# Patient Record
Sex: Male | Born: 1970
Health system: Southern US, Community
[De-identification: ages and names within clinical notes are randomized; demographics above are authoritative.]

## PROBLEM LIST (undated history)

## (undated) DIAGNOSIS — T7840XA Allergy, unspecified, initial encounter: Secondary | ICD-10-CM

## (undated) DIAGNOSIS — K219 Gastro-esophageal reflux disease without esophagitis: Secondary | ICD-10-CM

## (undated) DIAGNOSIS — K449 Diaphragmatic hernia without obstruction or gangrene: Secondary | ICD-10-CM

## (undated) DIAGNOSIS — R972 Elevated prostate specific antigen [PSA]: Secondary | ICD-10-CM

## (undated) DIAGNOSIS — D351 Benign neoplasm of parathyroid gland: Secondary | ICD-10-CM

## (undated) DIAGNOSIS — Z800 Family history of malignant neoplasm of digestive organs: Secondary | ICD-10-CM

## (undated) HISTORY — DX: Diaphragmatic hernia without obstruction or gangrene: K44.9

## (undated) HISTORY — DX: Family history of malignant neoplasm of digestive organs: Z80.0

## (undated) HISTORY — DX: Elevated prostate specific antigen (PSA): R97.20

## (undated) HISTORY — DX: Gastro-esophageal reflux disease without esophagitis: K21.9

## (undated) HISTORY — DX: Benign neoplasm of parathyroid gland: D35.1

## (undated) HISTORY — PX: WRIST SURGERY: SHX841

## (undated) HISTORY — DX: Allergy, unspecified, initial encounter: T78.40XA

---

## 2007-08-10 ENCOUNTER — Ambulatory Visit: Payer: Self-pay | Admitting: Gastroenterology

## 2007-08-10 LAB — CONVERTED CEMR LAB
ALT: 44 units/L (ref 0–53)
Alkaline Phosphatase: 81 units/L (ref 39–117)
BUN: 12 mg/dL (ref 6–23)
Basophils Absolute: 0.1 10*3/uL (ref 0.0–0.1)
CO2: 30 meq/L (ref 19–32)
Calcium: 10.6 mg/dL — ABNORMAL HIGH (ref 8.4–10.5)
Chloride: 107 meq/L (ref 96–112)
Creatinine, Ser: 0.9 mg/dL (ref 0.4–1.5)
Eosinophils Relative: 6.1 % — ABNORMAL HIGH (ref 0.0–5.0)
GFR calc Af Amer: 122 mL/min
HCT: 45.1 % (ref 39.0–52.0)
Hemoglobin: 15.8 g/dL (ref 13.0–17.0)
Lymphocytes Relative: 46.7 % — ABNORMAL HIGH (ref 12.0–46.0)
Monocytes Absolute: 0.6 10*3/uL (ref 0.2–0.7)
Monocytes Relative: 7.6 % (ref 3.0–11.0)
Neutro Abs: 3.1 10*3/uL (ref 1.4–7.7)
Neutrophils Relative %: 38.6 % — ABNORMAL LOW (ref 43.0–77.0)
Platelets: 215 10*3/uL (ref 150–400)
Sodium: 142 meq/L (ref 135–145)
TSH: 1.37 microintl units/mL (ref 0.35–5.50)
Total Protein: 7 g/dL (ref 6.0–8.3)
WBC: 8 10*3/uL (ref 4.5–10.5)

## 2007-08-26 ENCOUNTER — Ambulatory Visit: Payer: Self-pay | Admitting: Gastroenterology

## 2007-08-26 LAB — CONVERTED CEMR LAB: Calcium Ionized: 1.55 mmol/L — ABNORMAL HIGH (ref 1.12–1.32)

## 2007-09-02 ENCOUNTER — Ambulatory Visit: Payer: Self-pay | Admitting: Gastroenterology

## 2007-09-02 LAB — CONVERTED CEMR LAB: Calcium, Total (PTH): 11.6 mg/dL — ABNORMAL HIGH (ref 8.4–10.5)

## 2007-09-17 ENCOUNTER — Ambulatory Visit: Payer: Self-pay | Admitting: Gastroenterology

## 2007-09-20 ENCOUNTER — Encounter: Payer: Self-pay | Admitting: Endocrinology

## 2007-09-20 ENCOUNTER — Ambulatory Visit: Payer: Self-pay | Admitting: Endocrinology

## 2007-09-20 DIAGNOSIS — E210 Primary hyperparathyroidism: Secondary | ICD-10-CM | POA: Insufficient documentation

## 2007-09-20 LAB — CONVERTED CEMR LAB: Prolactin: 6.2 ng/mL

## 2007-09-28 ENCOUNTER — Telehealth (INDEPENDENT_AMBULATORY_CARE_PROVIDER_SITE_OTHER): Payer: Self-pay | Admitting: *Deleted

## 2007-10-12 ENCOUNTER — Encounter: Payer: Self-pay | Admitting: Endocrinology

## 2007-10-15 ENCOUNTER — Ambulatory Visit (HOSPITAL_COMMUNITY): Admission: RE | Admit: 2007-10-15 | Discharge: 2007-10-15 | Payer: Self-pay | Admitting: Psychology

## 2007-10-26 ENCOUNTER — Encounter: Payer: Self-pay | Admitting: Endocrinology

## 2007-11-25 HISTORY — PX: PARATHYROIDECTOMY: SHX19

## 2008-02-01 DIAGNOSIS — J309 Allergic rhinitis, unspecified: Secondary | ICD-10-CM | POA: Insufficient documentation

## 2008-02-01 DIAGNOSIS — R1011 Right upper quadrant pain: Secondary | ICD-10-CM | POA: Insufficient documentation

## 2008-02-01 DIAGNOSIS — A779 Spotted fever, unspecified: Secondary | ICD-10-CM | POA: Insufficient documentation

## 2008-02-01 DIAGNOSIS — K219 Gastro-esophageal reflux disease without esophagitis: Secondary | ICD-10-CM | POA: Insufficient documentation

## 2008-02-01 DIAGNOSIS — K449 Diaphragmatic hernia without obstruction or gangrene: Secondary | ICD-10-CM | POA: Insufficient documentation

## 2008-02-11 ENCOUNTER — Ambulatory Visit (HOSPITAL_COMMUNITY): Admission: RE | Admit: 2008-02-11 | Discharge: 2008-02-11 | Payer: Self-pay | Admitting: General Surgery

## 2008-02-15 ENCOUNTER — Encounter: Payer: Self-pay | Admitting: Endocrinology

## 2008-03-01 ENCOUNTER — Telehealth (INDEPENDENT_AMBULATORY_CARE_PROVIDER_SITE_OTHER): Payer: Self-pay | Admitting: *Deleted

## 2008-03-07 ENCOUNTER — Telehealth: Payer: Self-pay | Admitting: Endocrinology

## 2008-04-18 ENCOUNTER — Encounter: Payer: Self-pay | Admitting: Gastroenterology

## 2008-04-18 ENCOUNTER — Encounter: Payer: Self-pay | Admitting: Endocrinology

## 2008-05-23 ENCOUNTER — Encounter: Payer: Self-pay | Admitting: Endocrinology

## 2008-05-29 ENCOUNTER — Encounter: Payer: Self-pay | Admitting: Endocrinology

## 2008-05-29 ENCOUNTER — Encounter: Payer: Self-pay | Admitting: Gastroenterology

## 2008-05-30 ENCOUNTER — Encounter: Payer: Self-pay | Admitting: Endocrinology

## 2008-06-27 ENCOUNTER — Encounter: Payer: Self-pay | Admitting: Gastroenterology

## 2008-06-27 ENCOUNTER — Encounter: Payer: Self-pay | Admitting: Endocrinology

## 2009-06-01 IMAGING — NM NM OCTREOTIDE LOCALIZATION
1 series · 6 of 6 positions shown · non-contrast
Comparison: none

CLINICAL DATA: Hyperthyroidism.  Evaluate for hyperparathyroidism.
 NUCLEAR MEDICINE PARATHYROID SCINTIGRAPHY AND SPECT IMAGING ? 10/15/07:
TECHNIQUE: Following intravenous administration of radiopharmaceutical, early and 2-hour delayed planar images were obtained in the anterior projection.  Delayed triplanar SPECT images were also obtained at 2 hours.  
 Radiopharmaceutical:  25 mCi Bc-33m sestamibi.

[Series 1: ps parathyroid spect · 4.70mm/px · 6 of 64 frames shown]
[frame 6/64]
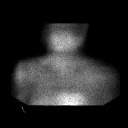
[frame 16/64]
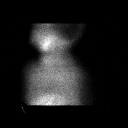
[frame 27/64]
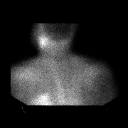
[frame 38/64]
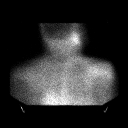
[frame 48/64]
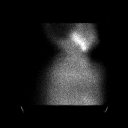
[frame 59/64]
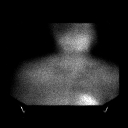

[6 of 6 positions shown; findings below may reference images not displayed]

FINDINGS: Images of the neck were obtained at 15 minutes, one hour, and two hours after injection.  No residual activity is seen to indicate a parathyroid adenoma.
IMPRESSION: Negative nuclear medicine study for parathyroid adenoma.

## 2009-07-05 ENCOUNTER — Telehealth: Payer: Self-pay | Admitting: Orthopedic Surgery

## 2009-07-26 ENCOUNTER — Encounter: Payer: Self-pay | Admitting: Orthopedic Surgery

## 2011-04-08 NOTE — Assessment & Plan Note (Signed)
[facility]                         GASTROENTEROLOGY OFFICE NOTE   Cole Espinoza, Cole Espinoza                    MRN:          [id number]  DATE:09/02/2007                            DOB:          [birth year]    Cole Espinoza continues to complain of intermittent subxyphoid discomfort  despite taking daily Nexium.  All of his laboratory data was  unremarkable, except for an elevated calcium and a slightly elevated  ionized calcium level of 1.55, normal 1.12 to 1.32.  I have sent him by  the lab today to check a parahormone level.   Vital signs were all normal, as is abdominal exam.  I cannot really  appreciate any abdominal masses or localized areas of tenderness.  Bowel  sounds are normal.   RECOMMENDATIONS:  1. Endoscopic exam.  2. Continue Nexium with p.r.n. Levsin use.  3. Perhaps he needs Endocrinology referral.     Vania Rea. Jarold Motto, MD, Caleen Essex, FAGA  Electronically Signed    DRP/MedQ  DD: 09/02/2007  DT: 09/02/2007  Job #: [id number]

## 2011-04-08 NOTE — Assessment & Plan Note (Signed)
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  Farris Has for evaluation of right-  sided chest-upper quadrant pain.   HISTORY OF PRESENT ILLNESS:  Cole Espinoza has had recurrent cramping and sharp  pain just to the right of his subxyphoid area on-and-off for the last 4-  5 years with more intense episodes over the last several weeks. These  are usually precipitated by eating and will last 1-2 hours in duration  and are partially relieved by over-the-counter Prilosec. He does have  some reflux symptoms especially at nighttime with burning substernal  chest pain and regurgitation. He has had no dysphagia. He recently saw  Dr.  Farris Has and had upper abdominal ultrasound examination performed at  [facility] on July 23, 2007 and this was entirely  normal.   The patient denies clay colored stools, dark urine, icterus, or other  hepatobiliary complaints. He has never had barium studies or endoscopic  examinations. He follows a regular diet and denies specific food  intolerances. He denies bowel irregularity or melena or hematochezia.   PAST MEDICAL HISTORY:  The patient was recently treated for a tick bite  exposure with possible early [facility] Spotted Fever and is on  doxycycline. He otherwise has been in good health and denies chronic  medical problems.   FAMILY HISTORY:  Is remarkable for colon cancer in one grandparent and  prostate in another. There is no known other gastrointestinal problems  in his family.   SOCIAL HISTORY:  He is married. He lives with his wife and one child. He  has a bachelor's degree in forestry and is actively employed. He does  not smoke or use  ethanol.   REVIEW OF SYSTEMS:  Currently noncontributory, although he was having  fever, chills, night sweats before beginning doxycycline. He denies  current cardiovascular or pulmonary symptoms. His chest pain does not  radiate into his back and there is no associated genitourinary  complaints.   PHYSICAL EXAMINATION:  He is a healthy-appearing white male in no  distress. Appears his stated age. He is 6 feet tall and weighs 210  pounds. Blood pressure 124/70, pulse 84 and regular.  I could not appreciate stigmata of chronic liver disease and there was  no thyromegaly.  CHEST: Was  entirely clear.  He is in a regular rhythm without murmurs, gallops or rubs.  ABDOMEN: Was negative without any organomegaly, masses or tenderness.  Bowel sounds were normal. There was no real tenderness in the right  precordial chest area.  Peripheral extremities were unremarkable.  Mental status was clear.  RECTAL: Deferred.   ASSESSMENT:  1. Probable hiatal hernia and atypical acid reflux symptoms.  2. Tick bite exposure, currently on doxycycline therapy.  3. Vague family history of colon cancer in a grandfather at an elderly  age.  22. Vague history of prostate cancer again in a grandparent at an      elderly age.   RECOMMENDATIONS:  1. Reflux regimen movie shown to patient.  2. Followup Nexium 40 mg 30 minutes before supper at night since most      of his symptoms are nocturnal.  3. P.r.n. Levsin as needed.  4. GI followup in several weeks time to decide if he needs endoscopy      and on his clinical course and workup.  5. Check screening laboratory parameters including liver function      tests.     Vania Rea. Jarold Motto, MD, Caleen Essex, FAGA  Electronically Signed    DRP/MedQ  DD: 08/10/2007  DT: 08/10/2007  Job #: [id number]   cc:   Gasper Sells

## 2011-11-25 HISTORY — PX: UPPER GASTROINTESTINAL ENDOSCOPY: SHX188

## 2012-01-16 ENCOUNTER — Encounter: Payer: Self-pay | Admitting: Gastroenterology

## 2012-01-22 ENCOUNTER — Encounter: Payer: Self-pay | Admitting: *Deleted

## 2012-01-23 ENCOUNTER — Encounter: Payer: Self-pay | Admitting: Gastroenterology

## 2012-01-23 ENCOUNTER — Ambulatory Visit (INDEPENDENT_AMBULATORY_CARE_PROVIDER_SITE_OTHER): Payer: BC Managed Care – PPO | Admitting: Gastroenterology

## 2012-01-23 VITALS — BP 128/80 | HR 68 | Ht 72.0 in | Wt 221.0 lb

## 2012-01-23 DIAGNOSIS — G4700 Insomnia, unspecified: Secondary | ICD-10-CM

## 2012-01-23 DIAGNOSIS — Z862 Personal history of diseases of the blood and blood-forming organs and certain disorders involving the immune mechanism: Secondary | ICD-10-CM

## 2012-01-23 DIAGNOSIS — F5109 Other insomnia not due to a substance or known physiological condition: Secondary | ICD-10-CM

## 2012-01-23 DIAGNOSIS — K219 Gastro-esophageal reflux disease without esophagitis: Secondary | ICD-10-CM

## 2012-01-23 DIAGNOSIS — Z8639 Personal history of other endocrine, nutritional and metabolic disease: Secondary | ICD-10-CM | POA: Insufficient documentation

## 2012-01-23 DIAGNOSIS — F519 Sleep disorder not due to a substance or known physiological condition, unspecified: Secondary | ICD-10-CM | POA: Insufficient documentation

## 2012-01-23 MED ORDER — METOCLOPRAMIDE HCL 10 MG PO TABS: 10.0000 mg | ORAL_TABLET | Freq: Every day | ORAL | Status: DC

## 2012-01-23 MED ORDER — ESOMEPRAZOLE MAGNESIUM 40 MG PO CPDR: 40.0000 mg | DELAYED_RELEASE_CAPSULE | Freq: Every day | ORAL | Status: DC

## 2012-01-23 NOTE — Progress Notes (Signed)
History of Present Illness:  This is a 41 year old Caucasian male with chronic GERD. I saw him previously in 2008 for this problem, and endoscopy showed a hiatal hernia but otherwise was unremarkable. He has been off of acid reflux medication, and for the last several months has had rather marked hoarseness, dry mouth syndrome, and some choking. ENT evaluation apparently was negative although I do not think he had laryngoscopy. He has a history of allergic sinusitis but denies skin rashes, joint swelling, or allergy shot use. He has had a couple of episodes where he is awakened at night with gasping and coughing. He's had no anorexia, weight loss , or hepatobiliary complaints, but he has had chronic vague right upper quadrant pain with previous negative ultrasound. Prior evaluations did show hypercalcemia, he was found to have hyperparathyroidism, and had parathyroidectomy. He sees Dr. Leary Roca in [place], and apparently his labs have been normal. He specifically denies rectal bleeding, bowel regularity or any family history of gastrointestinal malignancy.  I have reviewed this patient's present history, medical and surgical past history, allergies and medications.     ROS: The remainder of the 10 point ROS is negative  No Known Allergies No outpatient prescriptions prior to visit.   Past Medical History  Diagnosis Date  . Esophageal reflux   . Hiatal hernia   . Family history of malignant neoplasm of gastrointestinal tract   . Parathyroid adenoma    Past Surgical History  Procedure Date  . Parathyroidectomy 2009    History   Social History  . Marital Status: Married    Spouse Name: N/A    Number of Children: 1  . Years of Education: N/A   Occupational History  . Forester    Social History Main Topics  . Smoking status: Never Smoker   . Smokeless tobacco: Never Used  . Alcohol Use: No  . Drug Use: No  . Sexually Active: None   Other Topics Concern  . None    Social History Narrative   Daily caffeine    Family History  Problem Relation Age of Onset  . Colon cancer Neg Hx   . Prostate cancer Father         Physical Exam: Healthy patient to distress. Blood pressure 120/80, pulse 68 and regular, and BMI is 29.97. Cannot appreciate stigmata of chronic liver disease. Emanation of the oropharyngeal area is unremarkable. General well developed well nourished patient in no acute distress, appearing his stated age EYES PERRLA, no icterus, fundoscopic exam per opthamologist Skin no lesions noted Neck supple, no adenopathy, no thyroid enlargement, no tenderness Chest clear to percussion and auscultation Heart no significant murmurs, gallops or rubs noted Abdomen no hepatosplenomegaly masses or tenderness, BS normal.  Extremities no acute joint lesions, edema, phlebitis or evidence of cellulitis. Neurologic patient oriented x 3, cranial nerves intact, no focal neurologic deficits noted. Psychological mental status normal and normal affect.  Assessment and plan: Extra esophageal manifestations of GERD, rule out peptic stricture the esophagus, rule out eosinophilic esophagitis. I reviewed a reflux regime with him in detail ,and we'll change him to Dexilant 60 mg every morning with Reglan 10 mg at bedtime. He may need esophageal manometry and consideration of fundoplication. I have reiterated to the patient that these types of symptoms require months of intensive acid reflux therapy. Will ask his primary care physician for copy of his recent laboratory findings including serum calcium level. Please copy Dr. Leary Roca in [place]  Encounter Diagnosis  Name  Primary?  . Esophageal reflux Yes

## 2012-01-23 NOTE — Patient Instructions (Signed)
Your procedure has been scheduled for 01/26/2012, please follow the seperate instructions.  Today you watched a movie on [place].  Stop your Prilosec and start Nexium once a day 30 min before breakfast.  Take Reglan one tablet by mouth at bedtime.  Your prescription(s) have been sent to you pharmacy.

## 2012-01-26 ENCOUNTER — Ambulatory Visit (AMBULATORY_SURGERY_CENTER): Payer: BC Managed Care – PPO | Admitting: Gastroenterology

## 2012-01-26 ENCOUNTER — Encounter: Payer: Self-pay | Admitting: Gastroenterology

## 2012-01-26 VITALS — BP 139/81 | HR 71 | Temp 98.6°F | Resp 20 | Ht 72.0 in | Wt 221.0 lb

## 2012-01-26 DIAGNOSIS — K219 Gastro-esophageal reflux disease without esophagitis: Secondary | ICD-10-CM

## 2012-01-26 DIAGNOSIS — D130 Benign neoplasm of esophagus: Secondary | ICD-10-CM

## 2012-01-26 DIAGNOSIS — K222 Esophageal obstruction: Secondary | ICD-10-CM | POA: Insufficient documentation

## 2012-01-26 MED ORDER — SODIUM CHLORIDE 0.9 % IV SOLN: 500.0000 mL | INTRAVENOUS | Status: DC

## 2012-01-26 NOTE — Patient Instructions (Signed)
Discharge instructions given with verbal understanding. Handouts on hiatal hernia and a dilatation diet. Resume previous medications.YOU HAD AN ENDOSCOPIC PROCEDURE TODAY AT THE [facility]: Refer to the procedure report that was given to you for any specific questions about what was found during the examination.  If the procedure report does not answer your questions, please call your gastroenterologist to clarify.  If you requested that your care partner not be given the details of your procedure findings, then the procedure report has been included in a sealed envelope for you to review at your convenience later.  YOU SHOULD EXPECT: Some feelings of bloating in the abdomen. Passage of more gas than usual.  Walking can help get rid of the air that was put into your GI tract during the procedure and reduce the bloating. If you had a lower endoscopy (such as a colonoscopy or flexible sigmoidoscopy) you may notice spotting of blood in your stool or on the toilet paper. If you underwent a bowel prep for your procedure, then you may not have a normal bowel movement for a few days.  DIET: Your first meal following the procedure should be a light meal and then it is ok to progress to your normal diet.  A half-sandwich or bowl of soup is an example of a good first meal.  Heavy or fried foods are harder to digest and may make you feel nauseous or bloated.  Likewise meals heavy in dairy and vegetables can cause extra gas to form and this can also increase the bloating.  Drink plenty of fluids but you should avoid alcoholic beverages for 24 hours.  ACTIVITY: Your care partner should take you home directly after the procedure.  You should plan to take it easy, moving slowly for the rest of the day.  You can resume normal activity the day after the procedure however you should NOT DRIVE or use heavy machinery for 24 hours (because of the sedation medicines used during the test).    SYMPTOMS TO REPORT  IMMEDIATELY: A gastroenterologist can be reached at any hour.  During normal business hours, 8:30 AM to 5:00 PM Monday through Friday, call [phone].  After hours and on weekends, please call the GI answering service at [phone] who will take a message and have the physician on call contact you.    Following upper endoscopy (EGD)  Vomiting of blood or coffee ground material  New chest pain or pain under the shoulder blades  Painful or persistently difficult swallowing  New shortness of breath  Fever of 100F or higher  Black, tarry-looking stools  FOLLOW UP: If any biopsies were taken you will be contacted by phone or by letter within the next 1-3 weeks.  Call your gastroenterologist if you have not heard about the biopsies in 3 weeks.  Our staff will call the home number listed on your records the next business day following your procedure to check on you and address any questions or concerns that you may have at that time regarding the information given to you following your procedure. This is a courtesy call and so if there is no answer at the home number and we have not heard from you through the emergency physician on call, we will assume that you have returned to your regular daily activities without incident.  SIGNATURES/CONFIDENTIALITY: You and/or your care partner have signed paperwork which will be entered into your electronic medical record.  These signatures attest to the fact that that  the information above on your After Visit Summary has been reviewed and is understood.  Full responsibility of the confidentiality of this discharge information lies with you and/or your care-partner.

## 2012-01-26 NOTE — Progress Notes (Signed)
Patient did not experience any of the following events: a burn prior to discharge; a fall within the facility; wrong site/side/patient/procedure/implant event; or a hospital transfer or hospital admission upon discharge from the facility. (G8907) Patient did not have preoperative order for IV antibiotic SSI prophylaxis. (G8918)  

## 2012-01-26 NOTE — Op Note (Signed)
 [facility] [address] [place], [place]  [address]  ENDOSCOPY PROCEDURE REPORT  PATIENT:  Cole Espinoza, Cole Espinoza  MR#:  [id number] BIRTHDATE:  Dec 04, 1970, 40 yrs. old  GENDER:  male  ENDOSCOPIST:  Vania Rea. Jarold Motto, MD, [facility] Referred by:  PROCEDURE DATE:  01/26/2012 PROCEDURE:  EGD with biopsy, [address], Maloney Dilation of Esophagus ASA CLASS:  Class II INDICATIONS:  GERD, dysphagia  MEDICATIONS:   propofol (Diprivan) 340 mg IV TOPICAL ANESTHETIC:  DESCRIPTION OF PROCEDURE:   After the risks and benefits of the procedure were explained, informed consent was obtained.  The LB GIF-H180 [id number] endoscope was introduced through the mouth and advanced to the second portion of the duodenum.  The instrument was slowly withdrawn as the mucosa was fully examined. <<PROCEDUREIMAGES>>  A hiatal hernia was found.  Otherwise the examination was normal. DILATED #22F MALONEY DILATOR.NO HEME OR PAIN.  The esophagus and gastroesophageal junction were completely normal in appearance. RANDOM BIOPSIES DONE.    Retroflexed views revealed a hiatal hernia.    The scope was then withdrawn from the patient and the procedure completed.  COMPLICATIONS:  None  ENDOSCOPIC IMPRESSION: 1) Hiatal hernia 2) Otherwise normal examination 3) A hiatal hernia MODERATE SIZED 4 CM. HH NOTED,?? OCCULT STRICTURE DILATED.ESOPHAGEAL BIOPSIES DONE. RECOMMENDATIONS: 1) Await biopsy results 2) dilatations PRN 3) continue current medications 4) OP follow-up in 2 weeks.  ______________________________ Vania Rea. Jarold Motto, MD, [facility] Graham  CC:  n. eSIGNED:   Vania Rea. Keelen Quevedo at 01/26/2012 03:15 PM  Annye English, [phone]

## 2012-01-27 ENCOUNTER — Telehealth: Payer: Self-pay

## 2012-01-27 NOTE — Telephone Encounter (Signed)
  Follow up Call-  Call back number 01/26/2012  Post procedure Call Back phone  # -[phone]  Permission to leave phone message Yes     Patient questions:  Do you have a fever, pain , or abdominal swelling? no Pain Score  0 *  Have you tolerated food without any problems? yes  Have you been able to return to your normal activities? yes  Do you have any questions about your discharge instructions: Diet   no Medications  no Follow up visit  no  Do you have questions or concerns about your Care? no  Actions: * If pain score is 4 or above: No action needed, pain <4.

## 2012-01-28 ENCOUNTER — Telehealth: Payer: Self-pay | Admitting: *Deleted

## 2012-01-28 NOTE — Telephone Encounter (Signed)
Pt needs f/u 2 weeks post EGD per Dr Jarold Motto. Mailed letter with appt for 3/21/13at 10:30am.

## 2012-01-30 ENCOUNTER — Encounter: Payer: Self-pay | Admitting: Gastroenterology

## 2012-02-05 ENCOUNTER — Encounter: Payer: Self-pay | Admitting: *Deleted

## 2012-02-12 ENCOUNTER — Encounter: Payer: Self-pay | Admitting: Gastroenterology

## 2012-02-12 ENCOUNTER — Ambulatory Visit (INDEPENDENT_AMBULATORY_CARE_PROVIDER_SITE_OTHER): Payer: BC Managed Care – PPO | Admitting: Gastroenterology

## 2012-02-12 VITALS — BP 114/80 | HR 60 | Ht 72.0 in | Wt 219.1 lb

## 2012-02-12 DIAGNOSIS — K219 Gastro-esophageal reflux disease without esophagitis: Secondary | ICD-10-CM

## 2012-02-12 DIAGNOSIS — K5090 Crohn's disease, unspecified, without complications: Secondary | ICD-10-CM | POA: Insufficient documentation

## 2012-02-12 MED ORDER — METOCLOPRAMIDE HCL 10 MG PO TABS: 10.0000 mg | ORAL_TABLET | Freq: Every day | ORAL | Status: DC

## 2012-02-12 MED ORDER — DEXLANSOPRAZOLE 60 MG PO CPDR: 60.0000 mg | DELAYED_RELEASE_CAPSULE | Freq: Every day | ORAL | Status: DC

## 2012-02-12 MED ORDER — METRONIDAZOLE 500 MG PO TABS: 500.0000 mg | ORAL_TABLET | Freq: Two times a day (BID) | ORAL | Status: DC

## 2012-02-12 NOTE — Patient Instructions (Addendum)
We have sent the following medications to your pharmacy for you to pick up at your convenience: Dexilant Reglan Please follow up with Dr Jarold Motto in 3 months. CC: Dr Lorelei Pont  Of note: Patient does NOT carry a diagnosis of Crohns' disease. This visit diagnosis was added on to this visit in error and will not be used in the future.   Also of note: I have spoken to [facility] pharmacy to d/c order for Flagyl sent in error on this patient. She verbalizes understanding and will do this.

## 2012-02-12 NOTE — Progress Notes (Signed)
This is a 41 year old Caucasian male with chronic hoarseness related to GERD. Recent endoscopy was unremarkable, and. Dilation was performed, and esophageal biopsies were normal. Patient was having horrible nocturnal acid reflux, and is currently asymptomatic on Reglan 10 mg at bedtime and Dexilant 60 mg every morning. We again reviewed a reflux regime and the probable need for chronic PPI therapy. He has had no side effects from Reglan. Has a very very strong family history of prostate cancer, and desires rectal exam and prostate exam today. He denies any genitourinary symptoms of hesitancy or frequency  Current Medications, Allergies, Past Medical History, Past Surgical History, Family History and Social History were reviewed in [employer] record.  Pertinent Review of Systems Negative   Physical Exam: Blood pressure 114/80, pulse 60 and regular, BMI 29.72... Healthy patient no acute distress. I cannot appreciate stigmata of chronic liver disease. Abdominal exam is unremarkable. Inspection the rectum and also is unremarkable, and rectal exam shows no masses, tenderness, prosthetic enlargement, nodularity, et Karie Soda. We'll status is normal.     Assessment and Plan: Chronic GERD doing well on Reglan and PPI medications. He will see me again in 3 months time for followup. His prostate exam today was entirely normal. Continue care with Dr. Lorelei Pont as scheduled. Encounter Diagnosis  Name Primary?  . Crohn disease Yes

## 2012-02-13 ENCOUNTER — Telehealth: Payer: Self-pay | Admitting: *Deleted

## 2012-02-13 NOTE — Telephone Encounter (Signed)
Advised patient that we have gotten a note from his pharmacy that his insurance will not cover Dexilant at this time. They will cover omeprazole, pantoprazole, Lansoprazole, Nexium. Patient states that he already has some Nexium on hand but started taking the Dexilant because Dr Jarold Motto thought it would be cheaper for him. Patient states that Nexium was effective from what he remembers. He will begin taking Nexium again, once daily 30 minutes before breakfast and will call us back to let us know if it is effective for him so we can send him a new prescription.

## 2016-12-22 DIAGNOSIS — Z23 Encounter for immunization: Secondary | ICD-10-CM | POA: Diagnosis not present

## 2017-09-25 DIAGNOSIS — M545 Low back pain: Secondary | ICD-10-CM | POA: Diagnosis not present

## 2017-09-30 DIAGNOSIS — Z0000 Encounter for general adult medical examination without abnormal findings: Secondary | ICD-10-CM | POA: Diagnosis not present

## 2017-10-07 DIAGNOSIS — Z0000 Encounter for general adult medical examination without abnormal findings: Secondary | ICD-10-CM | POA: Diagnosis not present

## 2017-10-07 DIAGNOSIS — M545 Low back pain: Secondary | ICD-10-CM | POA: Diagnosis not present

## 2017-10-07 DIAGNOSIS — Z23 Encounter for immunization: Secondary | ICD-10-CM | POA: Diagnosis not present

## 2017-10-07 DIAGNOSIS — R7309 Other abnormal glucose: Secondary | ICD-10-CM | POA: Diagnosis not present

## 2017-12-11 DIAGNOSIS — H52223 Regular astigmatism, bilateral: Secondary | ICD-10-CM | POA: Diagnosis not present

## 2017-12-11 DIAGNOSIS — H524 Presbyopia: Secondary | ICD-10-CM | POA: Diagnosis not present

## 2017-12-11 DIAGNOSIS — H5203 Hypermetropia, bilateral: Secondary | ICD-10-CM | POA: Diagnosis not present

## 2018-02-05 DIAGNOSIS — J09X2 Influenza due to identified novel influenza A virus with other respiratory manifestations: Secondary | ICD-10-CM | POA: Diagnosis not present

## 2018-02-05 DIAGNOSIS — R509 Fever, unspecified: Secondary | ICD-10-CM | POA: Diagnosis not present

## 2018-02-12 DIAGNOSIS — R002 Palpitations: Secondary | ICD-10-CM | POA: Diagnosis not present

## 2018-02-16 DIAGNOSIS — R002 Palpitations: Secondary | ICD-10-CM | POA: Diagnosis not present

## 2018-04-06 DIAGNOSIS — M545 Low back pain: Secondary | ICD-10-CM | POA: Diagnosis not present

## 2018-04-06 DIAGNOSIS — J302 Other seasonal allergic rhinitis: Secondary | ICD-10-CM | POA: Diagnosis not present

## 2018-04-06 DIAGNOSIS — R7301 Impaired fasting glucose: Secondary | ICD-10-CM | POA: Diagnosis not present

## 2018-10-20 DIAGNOSIS — Z0000 Encounter for general adult medical examination without abnormal findings: Secondary | ICD-10-CM | POA: Diagnosis not present

## 2018-10-26 DIAGNOSIS — Z23 Encounter for immunization: Secondary | ICD-10-CM | POA: Diagnosis not present

## 2018-10-26 DIAGNOSIS — G4709 Other insomnia: Secondary | ICD-10-CM | POA: Diagnosis not present

## 2018-10-26 DIAGNOSIS — Z0000 Encounter for general adult medical examination without abnormal findings: Secondary | ICD-10-CM | POA: Diagnosis not present

## 2019-08-26 DIAGNOSIS — D171 Benign lipomatous neoplasm of skin and subcutaneous tissue of trunk: Secondary | ICD-10-CM | POA: Diagnosis not present

## 2019-08-26 DIAGNOSIS — L821 Other seborrheic keratosis: Secondary | ICD-10-CM | POA: Diagnosis not present

## 2019-08-26 DIAGNOSIS — D225 Melanocytic nevi of trunk: Secondary | ICD-10-CM | POA: Diagnosis not present

## 2019-08-26 DIAGNOSIS — D2262 Melanocytic nevi of left upper limb, including shoulder: Secondary | ICD-10-CM | POA: Diagnosis not present

## 2019-09-02 DIAGNOSIS — Z23 Encounter for immunization: Secondary | ICD-10-CM | POA: Diagnosis not present

## 2019-09-02 DIAGNOSIS — H6991 Unspecified Eustachian tube disorder, right ear: Secondary | ICD-10-CM | POA: Diagnosis not present

## 2019-09-06 DIAGNOSIS — H9311 Tinnitus, right ear: Secondary | ICD-10-CM | POA: Diagnosis not present

## 2019-09-06 DIAGNOSIS — H9121 Sudden idiopathic hearing loss, right ear: Secondary | ICD-10-CM | POA: Diagnosis not present

## 2019-09-13 DIAGNOSIS — H9121 Sudden idiopathic hearing loss, right ear: Secondary | ICD-10-CM | POA: Diagnosis not present

## 2019-09-13 DIAGNOSIS — H9311 Tinnitus, right ear: Secondary | ICD-10-CM | POA: Diagnosis not present

## 2020-07-04 DIAGNOSIS — G4709 Other insomnia: Secondary | ICD-10-CM | POA: Diagnosis not present

## 2020-07-04 DIAGNOSIS — Z0000 Encounter for general adult medical examination without abnormal findings: Secondary | ICD-10-CM | POA: Diagnosis not present

## 2020-07-16 DIAGNOSIS — H52223 Regular astigmatism, bilateral: Secondary | ICD-10-CM | POA: Diagnosis not present

## 2020-07-16 DIAGNOSIS — H524 Presbyopia: Secondary | ICD-10-CM | POA: Diagnosis not present

## 2020-07-16 DIAGNOSIS — H5203 Hypermetropia, bilateral: Secondary | ICD-10-CM | POA: Diagnosis not present

## 2021-04-16 ENCOUNTER — Encounter: Payer: Self-pay | Admitting: Gastroenterology

## 2021-06-13 ENCOUNTER — Ambulatory Visit (AMBULATORY_SURGERY_CENTER): Payer: 59 | Admitting: *Deleted

## 2021-06-13 ENCOUNTER — Other Ambulatory Visit: Payer: Self-pay

## 2021-06-13 VITALS — Ht 72.0 in | Wt 215.0 lb

## 2021-06-13 DIAGNOSIS — Z1211 Encounter for screening for malignant neoplasm of colon: Secondary | ICD-10-CM

## 2021-06-13 MED ORDER — NA SULFATE-K SULFATE-MG SULF 17.5-3.13-1.6 GM/177ML PO SOLN: ORAL | 0 refills | Status: DC

## 2021-06-13 NOTE — Progress Notes (Signed)
Patient's pre-visit was done today over the phone with the patient due to COVID-19 pandemic. Name,DOB and address verified. Insurance verified. Patient denies any allergies to Eggs and Soy. Patient denies any problems with anesthesia/sedation. Patient denies taking diet pills or blood thinners. No home Oxygen. Packet of Prep instructions mailed to patient including a copy of a consent form-pt is aware. Patient understands to call us back with any questions or concerns. Patient is aware of our care-partner policy and [id number] safety protocol.   The patient is COVID-19 vaccinated, per patient.

## 2021-07-02 ENCOUNTER — Encounter: Payer: Self-pay | Admitting: Certified Registered Nurse Anesthetist

## 2021-07-03 ENCOUNTER — Other Ambulatory Visit: Payer: Self-pay

## 2021-07-03 ENCOUNTER — Encounter: Payer: Self-pay | Admitting: Gastroenterology

## 2021-07-03 ENCOUNTER — Ambulatory Visit (AMBULATORY_SURGERY_CENTER): Payer: 59 | Admitting: Gastroenterology

## 2021-07-03 VITALS — BP 122/81 | HR 64 | Temp 98.4°F | Resp 11 | Ht 72.0 in | Wt 215.0 lb

## 2021-07-03 DIAGNOSIS — K635 Polyp of colon: Secondary | ICD-10-CM | POA: Diagnosis not present

## 2021-07-03 DIAGNOSIS — K219 Gastro-esophageal reflux disease without esophagitis: Secondary | ICD-10-CM | POA: Diagnosis not present

## 2021-07-03 DIAGNOSIS — Z1211 Encounter for screening for malignant neoplasm of colon: Secondary | ICD-10-CM | POA: Diagnosis not present

## 2021-07-03 DIAGNOSIS — D124 Benign neoplasm of descending colon: Secondary | ICD-10-CM

## 2021-07-03 MED ORDER — SODIUM CHLORIDE 0.9 % IV SOLN: 500.0000 mL | Freq: Once | INTRAVENOUS | Status: AC

## 2021-07-03 NOTE — Progress Notes (Signed)
Called to room to assist during endoscopic procedure.  Patient ID and intended procedure confirmed with present staff. Received instructions for my participation in the procedure from the performing physician.  

## 2021-07-03 NOTE — Progress Notes (Signed)
   Referring Provider: Emelda Fear, DO Primary Care Physician:  Aldean Ast, PA-C  Reason for Consultation:  Colon cancer screening   IMPRESSION:  Colon cancer screening  PLAN: Colonoscopy in the [place] today  Please see the "Patient Instructions" section for addition details about the plan.  HPI: Cole Espinoza is a 50 y.o. male presents for screening colonoscopy.  No baseline GI symptoms.  No prior colonoscopy or colon cancer screening.  Previous EGD and empiric dilation with Dr. Sharlett Iles for reflux in 2013.    No known family history of colon cancer or polyps. No family history of uterine/endometrial cancer, pancreatic cancer or gastric/stomach cancer.   Past Medical History:  Diagnosis Date   Allergy    SEASONAL   Esophageal reflux    Family history of malignant neoplasm of gastrointestinal tract    Hiatal hernia    Parathyroid adenoma     Past Surgical History:  Procedure Laterality Date   PARATHYROIDECTOMY  11/25/2007   UPPER GASTROINTESTINAL ENDOSCOPY  2013   WRIST SURGERY     LEFT    Current Outpatient Medications  Medication Sig Dispense Refill   ibuprofen (ADVIL) 200 MG tablet Take 200 mg by mouth every 6 (six) hours as needed.     Na Sulfate-K Sulfate-Mg Sulf 17.5-3.13-1.6 GM/177ML SOLN Suprep (no substitutions)-TAKE AS DIRECTED. 354 mL 0   No current facility-administered medications for this visit.    Allergies as of 07/03/2021   (No Known Allergies)    Family History  Problem Relation Age of Onset   Prostate cancer Father    Prostate cancer Paternal Grandfather    Colon cancer Neg Hx    Colon polyps Neg Hx    Esophageal cancer Neg Hx    Stomach cancer Neg Hx    Rectal cancer Neg Hx       Physical Exam: General:   Alert,  well-nourished, pleasant and cooperative in NAD Head:  Normocephalic and atraumatic. Eyes:  Sclera clear, no icterus.   Conjunctiva pink. Ears:  Normal auditory acuity. Nose:  No deformity, discharge,  or  lesions. Mouth:  No deformity or lesions.   Neck:  Supple; no masses or thyromegaly. Lungs:  Clear throughout to auscultation.   No wheezes. Heart:  Regular rate and rhythm; no murmurs. Abdomen:  Soft, nontender, nondistended, normal bowel sounds, no rebound or guarding. No hepatosplenomegaly.   Rectal:  Deferred  Msk:  Symmetrical. No boney deformities LAD: No inguinal or umbilical LAD Extremities:  No clubbing or edema. Neurologic:  Alert and  oriented x4;  grossly nonfocal Skin:  Intact without significant lesions or rashes. Psych:  Alert and cooperative. Normal mood and affect.     [name] L. Tarri Glenn, MD, MPH 07/03/2021, 9:39 AM

## 2021-07-03 NOTE — Progress Notes (Signed)
Report given to PACU, vss 

## 2021-07-03 NOTE — Patient Instructions (Signed)
Follow a high fiber diet and continue present medications. Awaiting pathology results. Repeat colonoscopy date to be determined after path results are reviewed.  YOU HAD AN ENDOSCOPIC PROCEDURE TODAY AT [facility]:   Refer to the procedure report that was given to you for any specific questions about what was found during the examination.  If the procedure report does not answer your questions, please call your gastroenterologist to clarify.  If you requested that your care partner not be given the details of your procedure findings, then the procedure report has been included in a sealed envelope for you to review at your convenience later.  YOU SHOULD EXPECT: Some feelings of bloating in the abdomen. Passage of more gas than usual.  Walking can help get rid of the air that was put into your GI tract during the procedure and reduce the bloating. If you had a lower endoscopy (such as a colonoscopy or flexible sigmoidoscopy) you may notice spotting of blood in your stool or on the toilet paper. If you underwent a bowel prep for your procedure, you may not have a normal bowel movement for a few days.  Please Note:  You might notice some irritation and congestion in your nose or some drainage.  This is from the oxygen used during your procedure.  There is no need for concern and it should clear up in a day or so.  SYMPTOMS TO REPORT IMMEDIATELY:  Following lower endoscopy (colonoscopy or flexible sigmoidoscopy):  Excessive amounts of blood in the stool  Significant tenderness or worsening of abdominal pains  Swelling of the abdomen that is new, acute  Fever of 100F or higher   For urgent or emergent issues, a gastroenterologist can be reached at any hour by calling [phone]. Do not use MyChart messaging for urgent concerns.    DIET:  We do recommend a small meal at first, but then you may proceed to your regular diet.  Drink plenty of fluids but you should avoid alcoholic  beverages for 24 hours.  ACTIVITY:  You should plan to take it easy for the rest of today and you should NOT DRIVE or use heavy machinery until tomorrow (because of the sedation medicines used during the test).    FOLLOW UP: Our staff will call the number listed on your records 48-72 hours following your procedure to check on you and address any questions or concerns that you may have regarding the information given to you following your procedure. If we do not reach you, we will leave a message.  We will attempt to reach you two times.  During this call, we will ask if you have developed any symptoms of COVID 19. If you develop any symptoms (ie: fever, flu-like symptoms, shortness of breath, cough etc.) before then, please call [phone].  If you test positive for Covid 19 in the 2 weeks post procedure, please call and report this information to [place].    If any biopsies were taken you will be contacted by phone or by letter within the next 1-3 weeks.  Please call us at [phone] if you have not heard about the biopsies in 3 weeks.    SIGNATURES/CONFIDENTIALITY: You and/or your care partner have signed paperwork which will be entered into your electronic medical record.  These signatures attest to the fact that that the information above on your After Visit Summary has been reviewed and is understood.  Full responsibility of the confidentiality of this discharge information lies  with you and/or your care-partner.  

## 2021-07-03 NOTE — Op Note (Signed)
[facility] Patient Name: Cole Espinoza Procedure Date: 07/03/2021 9:52 AM MRN: [id number] Endoscopist: Thornton Park MD, MD Age: 50 Referring MD:  Date of Birth: 1971/04/11 Gender: Male Account #: [id number] Procedure:                Colonoscopy Indications:              Screening for colorectal malignant neoplasm, This                            is the patient's first colonoscopy                           No known family history of colon cancer or polyps Medicines:                Monitored Anesthesia Care Procedure:                Pre-Anesthesia Assessment:                           - Prior to the procedure, a History and Physical                            was performed, and patient medications and                            allergies were reviewed. The patient's tolerance of                            previous anesthesia was also reviewed. The risks                            and benefits of the procedure and the sedation                            options and risks were discussed with the patient.                            All questions were answered, and informed consent                            was obtained. Prior Anticoagulants: The patient has                            taken no previous anticoagulant or antiplatelet                            agents. ASA Grade Assessment: II - A patient with                            mild systemic disease. After reviewing the risks                            and benefits, the patient was deemed in  satisfactory condition to undergo the procedure.                           After obtaining informed consent, the colonoscope                            was passed under direct vision. Throughout the                            procedure, the patient's blood pressure, pulse, and                            oxygen saturations were monitored continuously. The                            Olympus CF-HQ190L  [id number]) Colonoscope was                            introduced through the anus and advanced to the 3                            cm into the ileum. A second forward view of the                            right colon was performed. The colonoscopy was                            performed without difficulty. The patient tolerated                            the procedure well. The quality of the bowel                            preparation was good, although extensive lavage had                            to be performed. The terminal ileum, ileocecal                            valve, appendiceal orifice, and rectum were                            photographed. Scope In: 9:55:40 AM Scope Out: 10:17:16 AM Scope Withdrawal Time: 0 hours 17 minutes 20 seconds  Total Procedure Duration: 0 hours 21 minutes 36 seconds  Findings:                 Non-bleeding external and internal hemorrhoids were                            found.                           Multiple small and large-mouthed diverticula were  found in the entire colon. The diverticulosis is                            most dense in the sigmoid colon.                           Two sessile polyps were found in the descending                            colon. The polyps were 2 to 3 mm in size. These                            polyps were removed with a cold snare. Resection                            and retrieval were complete. Estimated blood loss                            was minimal.                           The exam was otherwise without abnormality on                            direct and retroflexion views. Complications:            No immediate complications. Estimated blood loss:                            Minimal. Estimated Blood Loss:     Estimated blood loss was minimal. Impression:               - Non-bleeding external and internal hemorrhoids.                           - Pancolonic  diverticulosis.                           - Two 2 to 3 mm polyps in the descending colon,                            removed with a cold snare. Resected and retrieved.                           - The examination was otherwise normal on direct                            and retroflexion views. Recommendation:           - Patient has a contact number available for                            emergencies. The signs and symptoms of potential  delayed complications were discussed with the                            patient. Return to normal activities tomorrow.                            Written discharge instructions were provided to the                            patient.                           - Follow a high fiber diet. Drink at least 64                            ounces of water daily. Add a daily stool bulking                            agent such as psyllium (an exampled would be                            Metamucil).                           - Continue present medications.                           - Await pathology results.                           - Repeat colonoscopy date to be determined after                            pending pathology results are reviewed for                            surveillance. Consider a two day bowel prep at that                            time.                           - Emerging evidence supports eating a diet of                            fruits, vegetables, grains, calcium, and yogurt                            while reducing red meat and alcohol may reduce the                            risk of colon cancer.                           - Thank you for allowing me to be involved in your  colon cancer prevention. Thornton Park MD, MD 07/03/2021 10:24:08 AM This report has been signed electronically.

## 2021-07-05 ENCOUNTER — Telehealth: Payer: Self-pay

## 2021-07-05 NOTE — Telephone Encounter (Signed)
Second follow up call attempt, no answer, LM

## 2021-07-05 NOTE — Telephone Encounter (Signed)
NO ANSWER, MESSAGE LEFT FOR PATIENT. 

## 2021-07-08 ENCOUNTER — Encounter: Payer: Self-pay | Admitting: Gastroenterology

## 2021-07-19 DIAGNOSIS — Z0000 Encounter for general adult medical examination without abnormal findings: Secondary | ICD-10-CM | POA: Diagnosis not present

## 2021-07-24 DIAGNOSIS — R972 Elevated prostate specific antigen [PSA]: Secondary | ICD-10-CM | POA: Diagnosis not present

## 2021-07-24 DIAGNOSIS — E785 Hyperlipidemia, unspecified: Secondary | ICD-10-CM | POA: Diagnosis not present

## 2021-07-24 DIAGNOSIS — Z0000 Encounter for general adult medical examination without abnormal findings: Secondary | ICD-10-CM | POA: Diagnosis not present

## 2021-12-09 DIAGNOSIS — H5203 Hypermetropia, bilateral: Secondary | ICD-10-CM | POA: Diagnosis not present

## 2021-12-09 DIAGNOSIS — H524 Presbyopia: Secondary | ICD-10-CM | POA: Diagnosis not present

## 2021-12-09 DIAGNOSIS — H52223 Regular astigmatism, bilateral: Secondary | ICD-10-CM | POA: Diagnosis not present

## 2022-11-08 DIAGNOSIS — H43811 Vitreous degeneration, right eye: Secondary | ICD-10-CM | POA: Diagnosis not present

## 2022-11-13 DIAGNOSIS — R972 Elevated prostate specific antigen [PSA]: Secondary | ICD-10-CM | POA: Diagnosis not present

## 2022-11-13 DIAGNOSIS — Z0000 Encounter for general adult medical examination without abnormal findings: Secondary | ICD-10-CM | POA: Diagnosis not present

## 2022-11-20 DIAGNOSIS — K219 Gastro-esophageal reflux disease without esophagitis: Secondary | ICD-10-CM | POA: Diagnosis not present

## 2022-11-20 DIAGNOSIS — E785 Hyperlipidemia, unspecified: Secondary | ICD-10-CM | POA: Diagnosis not present

## 2022-11-20 DIAGNOSIS — Z0000 Encounter for general adult medical examination without abnormal findings: Secondary | ICD-10-CM | POA: Diagnosis not present

## 2022-11-20 DIAGNOSIS — G4700 Insomnia, unspecified: Secondary | ICD-10-CM | POA: Diagnosis not present

## 2022-11-20 DIAGNOSIS — Z6829 Body mass index (BMI) 29.0-29.9, adult: Secondary | ICD-10-CM | POA: Diagnosis not present

## 2022-12-22 DIAGNOSIS — H5203 Hypermetropia, bilateral: Secondary | ICD-10-CM | POA: Diagnosis not present

## 2022-12-22 DIAGNOSIS — H43811 Vitreous degeneration, right eye: Secondary | ICD-10-CM | POA: Diagnosis not present

## 2022-12-22 DIAGNOSIS — H52223 Regular astigmatism, bilateral: Secondary | ICD-10-CM | POA: Diagnosis not present

## 2022-12-22 DIAGNOSIS — H524 Presbyopia: Secondary | ICD-10-CM | POA: Diagnosis not present

## 2023-03-31 DIAGNOSIS — M545 Low back pain, unspecified: Secondary | ICD-10-CM | POA: Diagnosis not present

## 2023-04-10 DIAGNOSIS — M545 Low back pain, unspecified: Secondary | ICD-10-CM | POA: Diagnosis not present

## 2023-04-14 DIAGNOSIS — M545 Low back pain, unspecified: Secondary | ICD-10-CM | POA: Diagnosis not present

## 2023-04-28 DIAGNOSIS — M545 Low back pain, unspecified: Secondary | ICD-10-CM | POA: Diagnosis not present

## 2023-05-08 DIAGNOSIS — M545 Low back pain, unspecified: Secondary | ICD-10-CM | POA: Diagnosis not present

## 2023-07-20 DIAGNOSIS — D2261 Melanocytic nevi of right upper limb, including shoulder: Secondary | ICD-10-CM | POA: Diagnosis not present

## 2023-07-20 DIAGNOSIS — L814 Other melanin hyperpigmentation: Secondary | ICD-10-CM | POA: Diagnosis not present

## 2023-07-20 DIAGNOSIS — L821 Other seborrheic keratosis: Secondary | ICD-10-CM | POA: Diagnosis not present

## 2023-07-20 DIAGNOSIS — L738 Other specified follicular disorders: Secondary | ICD-10-CM | POA: Diagnosis not present

## 2023-07-20 DIAGNOSIS — D1801 Hemangioma of skin and subcutaneous tissue: Secondary | ICD-10-CM | POA: Diagnosis not present

## 2023-07-20 DIAGNOSIS — L700 Acne vulgaris: Secondary | ICD-10-CM | POA: Diagnosis not present

## 2023-07-20 DIAGNOSIS — D2262 Melanocytic nevi of left upper limb, including shoulder: Secondary | ICD-10-CM | POA: Diagnosis not present

## 2023-08-19 DIAGNOSIS — S76311A Strain of muscle, fascia and tendon of the posterior muscle group at thigh level, right thigh, initial encounter: Secondary | ICD-10-CM | POA: Diagnosis not present

## 2023-08-19 DIAGNOSIS — M25561 Pain in right knee: Secondary | ICD-10-CM | POA: Diagnosis not present

## 2023-08-26 DIAGNOSIS — M25561 Pain in right knee: Secondary | ICD-10-CM | POA: Diagnosis not present

## 2023-08-26 DIAGNOSIS — M6281 Muscle weakness (generalized): Secondary | ICD-10-CM | POA: Diagnosis not present

## 2023-08-28 DIAGNOSIS — M6281 Muscle weakness (generalized): Secondary | ICD-10-CM | POA: Diagnosis not present

## 2023-08-28 DIAGNOSIS — M25561 Pain in right knee: Secondary | ICD-10-CM | POA: Diagnosis not present

## 2023-09-08 DIAGNOSIS — M25561 Pain in right knee: Secondary | ICD-10-CM | POA: Diagnosis not present

## 2023-09-08 DIAGNOSIS — M6281 Muscle weakness (generalized): Secondary | ICD-10-CM | POA: Diagnosis not present

## 2023-09-10 DIAGNOSIS — M6281 Muscle weakness (generalized): Secondary | ICD-10-CM | POA: Diagnosis not present

## 2023-09-10 DIAGNOSIS — M25561 Pain in right knee: Secondary | ICD-10-CM | POA: Diagnosis not present

## 2023-09-15 DIAGNOSIS — M25561 Pain in right knee: Secondary | ICD-10-CM | POA: Diagnosis not present

## 2023-09-15 DIAGNOSIS — M6281 Muscle weakness (generalized): Secondary | ICD-10-CM | POA: Diagnosis not present

## 2023-09-24 DIAGNOSIS — S76311D Strain of muscle, fascia and tendon of the posterior muscle group at thigh level, right thigh, subsequent encounter: Secondary | ICD-10-CM | POA: Diagnosis not present

## 2023-09-24 DIAGNOSIS — M25561 Pain in right knee: Secondary | ICD-10-CM | POA: Diagnosis not present

## 2023-10-09 DIAGNOSIS — M25561 Pain in right knee: Secondary | ICD-10-CM | POA: Diagnosis not present

## 2023-10-27 DIAGNOSIS — S83241D Other tear of medial meniscus, current injury, right knee, subsequent encounter: Secondary | ICD-10-CM | POA: Diagnosis not present

## 2023-12-03 ENCOUNTER — Encounter (HOSPITAL_BASED_OUTPATIENT_CLINIC_OR_DEPARTMENT_OTHER): Payer: Self-pay | Admitting: Orthopedic Surgery

## 2023-12-11 ENCOUNTER — Ambulatory Visit (HOSPITAL_BASED_OUTPATIENT_CLINIC_OR_DEPARTMENT_OTHER): Payer: 59 | Admitting: Anesthesiology

## 2023-12-11 ENCOUNTER — Other Ambulatory Visit: Payer: Self-pay

## 2023-12-11 ENCOUNTER — Encounter (HOSPITAL_BASED_OUTPATIENT_CLINIC_OR_DEPARTMENT_OTHER): Payer: Self-pay | Admitting: Orthopedic Surgery

## 2023-12-11 ENCOUNTER — Ambulatory Visit (HOSPITAL_BASED_OUTPATIENT_CLINIC_OR_DEPARTMENT_OTHER)
Admission: RE | Admit: 2023-12-11 | Discharge: 2023-12-11 | Disposition: A | Discharge status: Home / Self Care | Payer: 59 | Attending: Orthopedic Surgery | Admitting: Orthopedic Surgery

## 2023-12-11 ENCOUNTER — Encounter (HOSPITAL_BASED_OUTPATIENT_CLINIC_OR_DEPARTMENT_OTHER)
Admission: RE | Disposition: A | Discharge status: Home / Self Care | Payer: Self-pay | Source: Home / Self Care | Attending: Orthopedic Surgery

## 2023-12-11 DIAGNOSIS — S83241A Other tear of medial meniscus, current injury, right knee, initial encounter: Secondary | ICD-10-CM | POA: Insufficient documentation

## 2023-12-11 DIAGNOSIS — Z419 Encounter for procedure for purposes other than remedying health state, unspecified: Secondary | ICD-10-CM

## 2023-12-11 DIAGNOSIS — M94261 Chondromalacia, right knee: Secondary | ICD-10-CM | POA: Diagnosis not present

## 2023-12-11 DIAGNOSIS — K219 Gastro-esophageal reflux disease without esophagitis: Secondary | ICD-10-CM | POA: Diagnosis not present

## 2023-12-11 DIAGNOSIS — X58XXXA Exposure to other specified factors, initial encounter: Secondary | ICD-10-CM | POA: Diagnosis not present

## 2023-12-11 DIAGNOSIS — G8918 Other acute postprocedural pain: Secondary | ICD-10-CM | POA: Diagnosis not present

## 2023-12-11 DIAGNOSIS — M2241 Chondromalacia patellae, right knee: Secondary | ICD-10-CM | POA: Diagnosis not present

## 2023-12-11 DIAGNOSIS — S83281A Other tear of lateral meniscus, current injury, right knee, initial encounter: Secondary | ICD-10-CM | POA: Diagnosis not present

## 2023-12-11 DIAGNOSIS — K449 Diaphragmatic hernia without obstruction or gangrene: Secondary | ICD-10-CM | POA: Insufficient documentation

## 2023-12-11 HISTORY — PX: KNEE ARTHROSCOPY: SHX127

## 2023-12-11 HISTORY — PX: MENISCUS REPAIR: SHX5179

## 2023-12-11 SURGERY — ARTHROSCOPY, KNEE
Anesthesia: General | Site: Knee | Laterality: Right

## 2023-12-11 MED ORDER — FENTANYL CITRATE (PF) 100 MCG/2ML IJ SOLN
INTRAMUSCULAR | Status: AC
  Filled 2023-12-11: qty 2

## 2023-12-11 MED ORDER — DEXMEDETOMIDINE HCL IN NACL 80 MCG/20ML IV SOLN
INTRAVENOUS | Status: DC | PRN
  Administered 2023-12-11 (×2): 4 ug via INTRAVENOUS

## 2023-12-11 MED ORDER — CEFAZOLIN SODIUM-DEXTROSE 2-4 GM/100ML-% IV SOLN
INTRAVENOUS | Status: AC
  Filled 2023-12-11: qty 100

## 2023-12-11 MED ORDER — CLONIDINE HCL (ANALGESIA) 100 MCG/ML EP SOLN
EPIDURAL | Status: DC | PRN
  Administered 2023-12-11: 50 ug

## 2023-12-11 MED ORDER — ONDANSETRON HCL 4 MG/2ML IJ SOLN
INTRAMUSCULAR | Status: AC
  Filled 2023-12-11: qty 2

## 2023-12-11 MED ORDER — OXYCODONE HCL 5 MG PO TABS: 5.0000 mg | ORAL_TABLET | ORAL | 0 refills | Status: DC | PRN

## 2023-12-11 MED ORDER — FENTANYL CITRATE (PF) 100 MCG/2ML IJ SOLN
50.0000 ug | Freq: Once | INTRAMUSCULAR | Status: AC
  Administered 2023-12-11: 50 ug via INTRAVENOUS

## 2023-12-11 MED ORDER — ACETAMINOPHEN 500 MG PO TABS
1000.0000 mg | ORAL_TABLET | Freq: Once | ORAL | Status: AC
Start: 2023-12-11 — End: 2023-12-11
  Administered 2023-12-11: 1000 mg via ORAL

## 2023-12-11 MED ORDER — DEXAMETHASONE SODIUM PHOSPHATE 10 MG/ML IJ SOLN
INTRAMUSCULAR | Status: DC | PRN
  Administered 2023-12-11: 10 mg via INTRAVENOUS

## 2023-12-11 MED ORDER — DEXAMETHASONE SODIUM PHOSPHATE 10 MG/ML IJ SOLN
INTRAMUSCULAR | Status: AC
  Filled 2023-12-11: qty 1

## 2023-12-11 MED ORDER — AMISULPRIDE (ANTIEMETIC) 5 MG/2ML IV SOLN: 10.0000 mg | Freq: Once | INTRAVENOUS | Status: DC | PRN

## 2023-12-11 MED ORDER — SODIUM CHLORIDE 0.9 % IR SOLN
Status: DC | PRN
  Administered 2023-12-11: 6000 mL

## 2023-12-11 MED ORDER — ACETAMINOPHEN 500 MG PO TABS: 1000.0000 mg | ORAL_TABLET | Freq: Once | ORAL | Status: DC

## 2023-12-11 MED ORDER — MIDAZOLAM HCL 2 MG/2ML IJ SOLN
2.0000 mg | Freq: Once | INTRAMUSCULAR | Status: AC
  Administered 2023-12-11: 2 mg via INTRAVENOUS

## 2023-12-11 MED ORDER — ROPIVACAINE HCL 5 MG/ML IJ SOLN
INTRAMUSCULAR | Status: DC | PRN
  Administered 2023-12-11: 25 mL via PERINEURAL

## 2023-12-11 MED ORDER — ACETAMINOPHEN 500 MG PO TABS
ORAL_TABLET | ORAL | Status: AC
  Filled 2023-12-11: qty 2

## 2023-12-11 MED ORDER — OXYCODONE HCL 5 MG PO TABS
ORAL_TABLET | ORAL | Status: AC
  Filled 2023-12-11: qty 1

## 2023-12-11 MED ORDER — ONDANSETRON HCL 4 MG PO TABS: 4.0000 mg | ORAL_TABLET | Freq: Three times a day (TID) | ORAL | 0 refills | Status: DC | PRN

## 2023-12-11 MED ORDER — MIDAZOLAM HCL 2 MG/2ML IJ SOLN
INTRAMUSCULAR | Status: AC
  Filled 2023-12-11: qty 2

## 2023-12-11 MED ORDER — PROPOFOL 10 MG/ML IV BOLUS
INTRAVENOUS | Status: DC | PRN
  Administered 2023-12-11: 100 mg via INTRAVENOUS
  Administered 2023-12-11: 200 mg via INTRAVENOUS

## 2023-12-11 MED ORDER — OXYCODONE HCL 5 MG/5ML PO SOLN: 5.0000 mg | Freq: Once | ORAL | Status: AC | PRN

## 2023-12-11 MED ORDER — FENTANYL CITRATE (PF) 250 MCG/5ML IJ SOLN
INTRAMUSCULAR | Status: DC | PRN
  Administered 2023-12-11 (×2): 50 ug via INTRAVENOUS

## 2023-12-11 MED ORDER — FENTANYL CITRATE (PF) 100 MCG/2ML IJ SOLN: 25.0000 ug | INTRAMUSCULAR | Status: DC | PRN

## 2023-12-11 MED ORDER — OXYCODONE HCL 5 MG PO TABS
5.0000 mg | ORAL_TABLET | Freq: Once | ORAL | Status: AC | PRN
  Administered 2023-12-11: 5 mg via ORAL

## 2023-12-11 MED ORDER — ONDANSETRON HCL 4 MG/2ML IJ SOLN
INTRAMUSCULAR | Status: DC | PRN
  Administered 2023-12-11: 4 mg via INTRAVENOUS

## 2023-12-11 MED ORDER — CEFAZOLIN SODIUM-DEXTROSE 2-4 GM/100ML-% IV SOLN: 2.0000 g | INTRAVENOUS | Status: DC

## 2023-12-11 MED ORDER — LIDOCAINE 2% (20 MG/ML) 5 ML SYRINGE
INTRAMUSCULAR | Status: DC | PRN
  Administered 2023-12-11: 40 mg via INTRAVENOUS

## 2023-12-11 MED ORDER — LACTATED RINGERS IV SOLN: INTRAVENOUS | Status: DC

## 2023-12-11 MED ORDER — SODIUM CHLORIDE 0.9 % IV SOLN: INTRAVENOUS | Status: DC | PRN

## 2023-12-11 SURGICAL SUPPLY — 36 items
BANDAGE ESMARK 6X9 LF (GAUZE/BANDAGES/DRESSINGS) IMPLANT
BLADE SHAVER TORPEDO 4X13 (MISCELLANEOUS) ×1 IMPLANT
BNDG ELASTIC 6INX 5YD STR LF (GAUZE/BANDAGES/DRESSINGS) ×1 IMPLANT
BNDG ESMARK 6X9 LF (GAUZE/BANDAGES/DRESSINGS)
CANNULA TWIST IN 8.25X7CM (CANNULA) IMPLANT
CLSR STERI-STRIP ANTIMIC 1/2X4 (GAUZE/BANDAGES/DRESSINGS) ×1 IMPLANT
CUFF TRNQT CYL 34X4.125X (TOURNIQUET CUFF) ×1 IMPLANT
CUTTER TENSIONER SUT 2-0 0 FBW (INSTRUMENTS) IMPLANT
DRAPE U-SHAPE 47X51 STRL (DRAPES) ×1 IMPLANT
DRAPE-T ARTHROSCOPY W/POUCH (DRAPES) ×1 IMPLANT
DURAPREP 26ML APPLICATOR (WOUND CARE) ×1 IMPLANT
FIBERSTICK 2 (SUTURE) IMPLANT
GAUZE PAD ABD 8X10 STRL (GAUZE/BANDAGES/DRESSINGS) ×1 IMPLANT
GAUZE SPONGE 4X4 12PLY STRL (GAUZE/BANDAGES/DRESSINGS) ×1 IMPLANT
GAUZE XEROFORM 1X8 LF (GAUZE/BANDAGES/DRESSINGS) ×1 IMPLANT
GLOVE BIO SURGEON STRL SZ7.5 (GLOVE) ×2 IMPLANT
GLOVE BIOGEL PI IND STRL 8 (GLOVE) ×2 IMPLANT
GOWN STRL REUS W/ TWL LRG LVL3 (GOWN DISPOSABLE) ×1 IMPLANT
GOWN STRL REUS W/TWL XL LVL3 (GOWN DISPOSABLE) ×2 IMPLANT
KIT SUTLOC MENISCAL ROOT REP (Anchor) IMPLANT
MANIFOLD NEPTUNE II (INSTRUMENTS) ×1 IMPLANT
NDL SUT 2-0 SCORPION KNEE (NEEDLE) IMPLANT
NEEDLE SUT 2-0 SCORPION KNEE (NEEDLE)
PACK ARTHROSCOPY DSU (CUSTOM PROCEDURE TRAY) ×1 IMPLANT
PACK BASIN DAY SURGERY FS (CUSTOM PROCEDURE TRAY) ×1 IMPLANT
SUCTION TUBE FRAZIER 10FR DISP (SUCTIONS) IMPLANT
SUT ETHILON 4 0 PS 2 18 (SUTURE) ×1 IMPLANT
SUT MNCRL AB 3-0 PS2 18 (SUTURE) ×1 IMPLANT
SUT PDS AB 0 CT 36 (SUTURE) IMPLANT
SUTURE TAPE TIGERLINK 1.3MM BL (SUTURE) IMPLANT
SUTURETAPE TIGERLINK 1.3MM BL (SUTURE) ×1
TOWEL GREEN STERILE FF (TOWEL DISPOSABLE) ×1 IMPLANT
TUBE CONNECTING 20X1/4 (TUBING) IMPLANT
TUBING ARTHROSCOPY IRRIG 16FT (MISCELLANEOUS) ×1 IMPLANT
WAND ABLATOR APOLLO I90 (BUR) IMPLANT
WRAP KNEE MAXI GEL POST OP (GAUZE/BANDAGES/DRESSINGS) IMPLANT

## 2023-12-11 NOTE — Op Note (Signed)
Surgery Date: 12/11/2023  Surgeon(s): Yolonda Kida, MD     Assistant:   Dion Saucier, PA-C     Assistant attestation:   PA McClung present for the entire procedure.   ANESTHESIA:  general, and Local   FLUIDS: Per anesthesia record.    ESTIMATED BLOOD LOSS: minimal   PREOPERATIVE DIAGNOSES:  1.  Right knee posterior horn root tear medial meniscus 2.  Right knee chondromalacia patella, medial femoral condyle   POSTOPERATIVE DIAGNOSES:  1.  Right knee posterior horn root tear medial meniscus 2.  Right knee chondromalacia patella, and medial tibial plateau grade 2. 3.  Right knee lateral meniscus tear, acute and initial encounter.   PROCEDURES PERFORMED:  1.  Right knee arthroscopy with limited synovectomy 2.  Right knee arthroscopy with arthroscopic chondroplasty posterior surface of patella as well as medial tibial plateau 3.  Right knee arthroscopic medial meniscus root repair with transosseous tunnel 4.  Right knee arthroscopic partial lateral meniscectomy   Implants:   Arthrex suture lock root repair device x 1   DESCRIPTION OF PROCEDURE: [name] is a pleasant 53 year old male with right knee medial meniscus root posterior horn meniscus tear. Plans are to proceed with partial medial meniscectomy versus repair and diagnostic arthroscopy with debridement as indicated. Full discussion held regarding risks benefits alternatives and complications related surgical intervention. Conservative care options reviewed. All questions answered.   The patient was identified in the preoperative holding area and the operative extremity was marked. The patient was brought to the operating room and transferred to operating table in a supine position. Satisfactory general anesthesia was induced by anesthesiology.     Standard anterolateral, anteromedial arthroscopy portals were obtained. The anteromedial portal was obtained with a spinal needle for localization under direct  visualization with subsequent diagnostic findings.    Anteromedial and anterolateral chambers: mild synovitis. The synovitis was debrided with a 4.5 mm full radius shaver through both the anteromedial and lateral portals.    Suprapatellar pouch and gutters: mild synovitis or debris. Patella chondral surface: Grade 2 Trochlear chondral surface: Grade 1 Patellofemoral tracking: Some lateral tilt  Medial meniscus: Complete radial tear posterior horn at the root attachment with extrusion.  Medial femoral condyle flexion bearing surface: Grade 0 Medial femoral condyle extension bearing surface: Grade 0 Medial tibial plateau: Grade 2 Anterior cruciate ligament:stable Posterior cruciate ligament:stable Lateral meniscus: A white zone tear horizontal of mid body and posterior horn. Lateral femoral condyle flexion bearing surface: Grade 0 Lateral femoral condyle extension bearing surface: Grade 0 Lateral tibial plateau: Grade 1     Chondroplasty was carried out with motorized shaver to debride unstable cartilage flaps from the posterior surface of the patella as well as medial femoral condyle.  We next moved forward with arthroscopic partial lateral meniscectomy utilizing motorized shaver to debride the unstable horizontal torn tissue of the mid body and posterior horn lateral meniscus.   The medial meniscus was repaired with a transosseous technique.  We utilized the Arthrex suture lock suture device with two 0 FiberWire sutures in a knotless fashion.  We first piecrust of the MCL to increase valgus opening of the medial compartment.  This allowed excellent access to the posterior horn of the medial meniscus.  We then introduced the transosseous drill guide and drilled antegrade to the detached position of the root.  We then passed our suture anchor from the medial portal out through the anterior medial drill tunnel.  This was secured in the standard fashion.  We then  passed a horizontal mattress  working stitch that was then secured to the knotless loop and the anchor to bring the meniscus nicely back to bone.  This had excellent fixation.   After completion of synovectomy, diagnostic exam, meniscectomy and meniscus repair, and debridements as described, all compartments were checked and no residual debris remained. Hemostasis was achieved with the cautery wand. The portals were approximated with buried monocryl. All excess fluid was expressed from the joint.  Xeroform sterile gauze dressings were applied followed by Ace bandage and ice pack.    DISPOSITION: The patient was awakened from general anesthetic, extubated, taken to the recovery room in medically stable condition, no apparent complications.  [name] will be up to 50% weightbearing for 4 weeks on the operative extremity.  He will take 81 mg aspirin twice daily x 6 weeks for DVT prophylaxis.  I will see her back in the office in 2 weeks for wound check.

## 2023-12-11 NOTE — Anesthesia Preprocedure Evaluation (Signed)
Anesthesia Evaluation  Patient identified by MRN, date of birth, ID band Patient awake    Reviewed: Allergy & Precautions, NPO status , Patient's Chart, lab work & pertinent test results  Airway Mallampati: II  TM Distance: >3 FB Neck ROM: Full    Dental  (+) Dental Advisory Given   Pulmonary neg pulmonary ROS   breath sounds clear to auscultation       Cardiovascular negative cardio ROS  Rhythm:Regular     Neuro/Psych negative neurological ROS     GI/Hepatic Neg liver ROS, hiatal hernia,GERD  Medicated and Controlled,,  Endo/Other  negative endocrine ROS    Renal/GU negative Renal ROS     Musculoskeletal   Abdominal   Peds  Hematology negative hematology ROS (+)   Anesthesia Other Findings   Reproductive/Obstetrics                             Anesthesia Physical Anesthesia Plan  ASA: 2  Anesthesia Plan: General   Post-op Pain Management: Regional block*, Tylenol PO (pre-op)* and Toradol IV (intra-op)*   Induction: Intravenous  PONV Risk Score and Plan: 2 and Dexamethasone, Midazolam, Ondansetron and Treatment may vary due to age or medical condition  Airway Management Planned: LMA  Additional Equipment: None  Intra-op Plan:   Post-operative Plan: Extubation in OR  Informed Consent: I have reviewed the patients History and Physical, chart, labs and discussed the procedure including the risks, benefits and alternatives for the proposed anesthesia with the patient or authorized representative who has indicated his/her understanding and acceptance.     Dental advisory given  Plan Discussed with: CRNA  Anesthesia Plan Comments:        Anesthesia Quick Evaluation

## 2023-12-11 NOTE — Anesthesia Postprocedure Evaluation (Signed)
Anesthesia Post Note  Patient: Cole Espinoza  Procedure(s) Performed: ARTHROSCOPY KNEE--PARTIAL LATERAL MENISECTOMY (Right: Knee) REPAIR OF MEDIAL ROOT (Right: Knee)     Patient location during evaluation: PACU Anesthesia Type: General Level of consciousness: awake and alert Pain management: pain level controlled Vital Signs Assessment: post-procedure vital signs reviewed and stable Respiratory status: spontaneous breathing, nonlabored ventilation, respiratory function stable and patient connected to nasal cannula oxygen Cardiovascular status: blood pressure returned to baseline and stable Postop Assessment: no apparent nausea or vomiting Anesthetic complications: no  No notable events documented.  Last Vitals:  Vitals:   12/11/23 1000 12/11/23 1021  BP: 103/70 118/86  Pulse: 61 62  Resp: 18 20  Temp:  (!) 36.2 C  SpO2: 100% 96%    Last Pain:  Vitals:   12/11/23 1021  TempSrc: Temporal  PainSc: 5                  Kennieth Rad

## 2023-12-11 NOTE — Brief Op Note (Signed)
12/11/2023  9:35 AM  PATIENT:  Cole Espinoza  53 y.o. male  PRE-OPERATIVE DIAGNOSIS:  Right knee medial root meniscus tear  POST-OPERATIVE DIAGNOSIS:  Right knee medial root meniscus tear  PROCEDURE:  Procedure(s): ARTHROSCOPY KNEE--PARTIAL LATERAL MENISECTOMY (Right) REPAIR OF MEDIAL ROOT (Right)  SURGEON:  Surgeons and Role:    * Yolonda Kida, MD - Primary  PHYSICIAN ASSISTANT: Dion Saucier, PA-C   ANESTHESIA:   regional and general  EBL:  15 mL   BLOOD ADMINISTERED:none  DRAINS: none   LOCAL MEDICATIONS USED:  NONE  SPECIMEN:  No Specimen  DISPOSITION OF SPECIMEN:  N/A  COUNTS:  YES  TOURNIQUET:   Total Tourniquet Time Documented: Thigh (Right) - 35 minutes Total: Thigh (Right) - 35 minutes   DICTATION: .Note written in EPIC  PLAN OF CARE: Discharge to home after PACU  PATIENT DISPOSITION:  PACU - hemodynamically stable.   Delay start of Pharmacological VTE agent (>24hrs) due to surgical blood loss or risk of bleeding: not applicable

## 2023-12-11 NOTE — H&P (Signed)
[facility]THOP[facility]IC H and P  REQUESTING PHYSICIAN: Yolonda Kida, MD  PCP:  Soledad Gerlach, FNP  Chief Complaint: Right knee pain  HPI: Cole Espinoza is a 53 y.o. male who complains of right knee pain and instability due to weakness.  Has had preoperative workup that demonstrated posterior horn medial meniscus root tear.  Here today for repair.  Past Medical History:  Diagnosis Date   Allergy    SEASONAL   Esophageal reflux    Family history of malignant neoplasm of gastrointestinal tract    Hiatal hernia    Parathyroid adenoma    Past Surgical History:  Procedure Laterality Date   PARATHYROIDECTOMY  11/25/2007   UPPER GASTROINTESTINAL ENDOSCOPY  2013   WRIST SURGERY     LEFT   Social History   Socioeconomic History   Marital status: Married    Spouse name: Not on file   Number of children: 1   Years of education: Not on file   Highest education level: Not on file  Occupational History   Occupation: Building services engineer  Tobacco Use   Smoking status: Never   Smokeless tobacco: Never  Vaping Use   Vaping status: Never Used  Substance and Sexual Activity   Alcohol use: Yes    Comment: occ beer   Drug use: No   Sexual activity: Not on file  Other Topics Concern   Not on file  Social History Narrative   Daily caffeine    Social Drivers of Corporate investment banker Strain: Not on file  Food Insecurity: Not on file  Transportation Needs: Not on file  Physical Activity: Not on file  Stress: Not on file  Social Connections: Not on file   Family History  Problem Relation Age of Onset   Prostate cancer Father    Prostate cancer Paternal Grandfather    Colon cancer Neg Hx    Colon polyps Neg Hx    Esophageal cancer Neg Hx    Stomach cancer Neg Hx    Rectal cancer Neg Hx    No Known Allergies Prior to Admission medications   Medication Sig Start Date End Date Taking? Authorizing Provider  ibuprofen (ADVIL) 200 MG tablet Take 200 mg by mouth every 6 (six)  hours as needed.   Yes [provider]  omeprazole (PRILOSEC) 20 MG capsule Take 20 mg by mouth daily.   Yes [provider]  esomeprazole (NEXIUM) 40 MG capsule Take 1 capsule (40 mg total) by mouth daily before breakfast. 01/23/12 02/12/12  Mardella Layman, MD   No results found.  Positive ROS: All other systems have been reviewed and were otherwise negative with the exception of those mentioned in the HPI and as above.  Physical Exam: General: Alert, no acute distress Cardiovascular: No pedal edema Respiratory: No cyanosis, no use of accessory musculature GI: No organomegaly, abdomen is soft and non-tender Skin: No lesions in the area of chief complaint Neurologic: Sensation intact distally Psychiatric: Patient is competent for consent with normal mood and affect Lymphatic: No axillary or cervical lymphadenopathy  MUSCULOSKELETAL: Right lower extremity is warm and well-perfused with open wounds or lesions.  Neurovascular intact.  Assessment: Plan to proceed today with arthroscopic assisted posterior horn medial meniscus root repair.  Plan: We again discussed the risk and benefits of the procedure in detail which include but not limited to bleeding, infection, damage to surrounding nerves and vessels, stiffness, failure of repairs, need for revision surgery, persistent pain, the risk of DVT  as well as the risk of anesthesia.  He has provided informed consent.  Discharge home postop from PACU.    Yolonda Kida, MD Cell [phone]    12/11/2023 7:27 AM

## 2023-12-11 NOTE — Discharge Instructions (Addendum)
DISCHARGE INSTRUCTIONS: ________________________________________________________________________________ Medial Meniscus Root post op  Elevate the leg above your heart as often as possible. 50 % Weight bear with the Bledsoe brace, use crutches  You should sleep in the knee brace with it locked in full extension.  He may remove for showering.  Otherwise he may remove for exercise. Start normal showering on postoperative day #3.  Do not submerge underwater Use pain medication as needed.  You may also take Tylenol and Advil around-the-clock in alternating fashion in addition to the pain medication.  To prevent constipation use Colace 100mg . twice a day while on pain medication.  If constipated, use Miralax 17 gm once a day and drink plenty of fluids.  These medications can be obtained at the pharmacy without a prescription.   Follow up in the office in 14 days. You may remove your postoperative bandages on the third day from surgery and begin showering.  Do not remove the Steri-Strips.  Do not submerge underwater.  Replace your Ace bandage over your wounds before reapplying your knee brace You should also continue to wear the TED hose for 2 weeks postoperatively. You should also take an 81 mg aspirin once per day x 6 weeks for the prevention of DVT.   **NO ADDITIONAL TYLENOL UNTIL AFTER 11:40AM TODAY  Regional Anesthesia Blocks  1. You may not be able to move or feel the "blocked" extremity after a regional anesthetic block. This may last may last from 3-48 hours after placement, but it will go away. The length of time depends on the medication injected and your individual response to the medication. As the nerves start to wake up, you may experience tingling as the movement and feeling returns to your extremity. If the numbness and inability to move your extremity has not gone away after 48 hours, please call your surgeon.   2. The extremity that is blocked will need to be protected until the  numbness is gone and the strength has returned. Because you cannot feel it, you will need to take extra care to avoid injury. Because it may be weak, you may have difficulty moving it or using it. You may not know what position it is in without looking at it while the block is in effect.  3. For blocks in the legs and feet, returning to weight bearing and walking needs to be done carefully. You will need to wait until the numbness is entirely gone and the strength has returned. You should be able to move your leg and foot normally before you try and bear weight or walk. You will need someone to be with you when you first try to ensure you do not fall and possibly risk injury.  4. Bruising and tenderness at the needle site are common side effects and will resolve in a few days.  5. Persistent numbness or new problems with movement should be communicated to the surgeon or the [facility] [phone] [facility] [phone]).  Post Anesthesia Home Care Instructions  Activity: Get plenty of rest for the remainder of the day. A responsible individual must stay with you for 24 hours following the procedure.  For the next 24 hours, DO NOT: -Drive a car -Advertising copywriter -Drink alcoholic beverages -Take any medication unless instructed by your physician -Make any legal decisions or sign important papers.  Meals: Start with liquid foods such as gelatin or soup. Progress to regular foods as tolerated. Avoid greasy, spicy, heavy foods. If nausea and/or  vomiting occur, drink only clear liquids until the nausea and/or vomiting subsides. Call your physician if vomiting continues.  Special Instructions/Symptoms: Your throat may feel dry or sore from the anesthesia or the breathing tube placed in your throat during surgery. If this causes discomfort, gargle with warm salt water. The discomfort should disappear within 24 hours.  If you had a scopolamine patch placed behind  your ear for the management of post- operative nausea and/or vomiting:  1. The medication in the patch is effective for 72 hours, after which it should be removed.  Wrap patch in a tissue and discard in the trash. Wash hands thoroughly with soap and water. 2. You may remove the patch earlier than 72 hours if you experience unpleasant side effects which may include dry mouth, dizziness or visual disturbances. 3. Avoid touching the patch. Wash your hands with soap and water after contact with the patch.

## 2023-12-11 NOTE — Anesthesia Procedure Notes (Signed)
Procedure Name: LMA Insertion Date/Time: 12/11/2023 8:41 AM  Performed by: Alvera Novel, CRNAPre-anesthesia Checklist: Patient identified, Emergency Drugs available, Suction available and Patient being monitored Patient Re-evaluated:Patient Re-evaluated prior to induction Oxygen Delivery Method: Circle System Utilized Preoxygenation: Pre-oxygenation with 100% oxygen Induction Type: IV induction LMA: LMA inserted LMA Size: 5.0 Number of attempts: 1 Placement Confirmation: positive ETCO2 Tube secured with: Tape Dental Injury: Teeth and Oropharynx as per pre-operative assessment

## 2023-12-11 NOTE — Anesthesia Procedure Notes (Signed)
Anesthesia Regional Block: Adductor canal block   Pre-Anesthetic Checklist: , timeout performed,  Correct Patient, Correct Site, Correct Laterality,  Correct Procedure, Correct Position, site marked,  Risks and benefits discussed,  Surgical consent,  Pre-op evaluation,  At surgeon's request and post-op pain management  Laterality: Right  Prep: chloraprep       Needles:  Injection technique: Single-shot  Needle Type: Echogenic Needle     Needle Length: 9cm  Needle Gauge: 21     Additional Needles:   Procedures:,,,, ultrasound used (permanent image in chart),,    Narrative:  Start time: 12/11/2023 7:52 AM End time: 12/11/2023 7:57 AM Injection made incrementally with aspirations every 5 mL.  Performed by: Personally  Anesthesiologist: Marcene Duos, MD

## 2023-12-11 NOTE — Progress Notes (Signed)
Assisted Dr. Rob Fitzgerald with right, adductor canal, ultrasound guided block. Side rails up, monitors on throughout procedure. See vital signs in flow sheet. Tolerated Procedure well. 

## 2023-12-11 NOTE — Transfer of Care (Signed)
Immediate Anesthesia Transfer of Care Note  Patient: Cole Espinoza  Procedure(s) Performed: ARTHROSCOPY KNEE--PARTIAL LATERAL MENISECTOMY (Right: Knee) REPAIR OF MEDIAL ROOT (Right: Knee)  Patient Location: PACU  Anesthesia Type:General  Level of Consciousness: awake, alert , and oriented  Airway & Oxygen Therapy: Patient Spontanous Breathing and Patient connected to face mask oxygen  Post-op Assessment: Report given to RN and Post -op Vital signs reviewed and stable  Post vital signs: Reviewed and stable  Last Vitals:  Vitals Value Taken Time  BP 89/56 12/11/23 0936  Temp    Pulse 61 12/11/23 0939  Resp 10 12/11/23 0939  SpO2 95 % 12/11/23 0939  Vitals shown include unfiled device data.  Last Pain:  Vitals:   12/11/23 0734  TempSrc: Temporal  PainSc: 0-No pain      Patients Stated Pain Goal: 3 (12/11/23 0734)  Complications: No notable events documented.

## 2023-12-14 ENCOUNTER — Encounter (HOSPITAL_BASED_OUTPATIENT_CLINIC_OR_DEPARTMENT_OTHER): Payer: Self-pay | Admitting: Orthopedic Surgery

## 2023-12-25 DIAGNOSIS — M6281 Muscle weakness (generalized): Secondary | ICD-10-CM | POA: Diagnosis not present

## 2023-12-25 DIAGNOSIS — M25561 Pain in right knee: Secondary | ICD-10-CM | POA: Diagnosis not present

## 2023-12-28 DIAGNOSIS — M25561 Pain in right knee: Secondary | ICD-10-CM | POA: Diagnosis not present

## 2023-12-28 DIAGNOSIS — M6281 Muscle weakness (generalized): Secondary | ICD-10-CM | POA: Diagnosis not present

## 2023-12-31 DIAGNOSIS — M25561 Pain in right knee: Secondary | ICD-10-CM | POA: Diagnosis not present

## 2023-12-31 DIAGNOSIS — M6281 Muscle weakness (generalized): Secondary | ICD-10-CM | POA: Diagnosis not present

## 2024-01-06 DIAGNOSIS — M6281 Muscle weakness (generalized): Secondary | ICD-10-CM | POA: Diagnosis not present

## 2024-01-06 DIAGNOSIS — M25561 Pain in right knee: Secondary | ICD-10-CM | POA: Diagnosis not present

## 2024-01-08 DIAGNOSIS — M25561 Pain in right knee: Secondary | ICD-10-CM | POA: Diagnosis not present

## 2024-01-08 DIAGNOSIS — M6281 Muscle weakness (generalized): Secondary | ICD-10-CM | POA: Diagnosis not present

## 2024-01-12 DIAGNOSIS — M6281 Muscle weakness (generalized): Secondary | ICD-10-CM | POA: Diagnosis not present

## 2024-01-12 DIAGNOSIS — M25561 Pain in right knee: Secondary | ICD-10-CM | POA: Diagnosis not present

## 2024-01-14 DIAGNOSIS — M6281 Muscle weakness (generalized): Secondary | ICD-10-CM | POA: Diagnosis not present

## 2024-01-14 DIAGNOSIS — M25561 Pain in right knee: Secondary | ICD-10-CM | POA: Diagnosis not present

## 2024-01-15 DIAGNOSIS — H5203 Hypermetropia, bilateral: Secondary | ICD-10-CM | POA: Diagnosis not present

## 2024-01-19 DIAGNOSIS — M25561 Pain in right knee: Secondary | ICD-10-CM | POA: Diagnosis not present

## 2024-01-19 DIAGNOSIS — M6281 Muscle weakness (generalized): Secondary | ICD-10-CM | POA: Diagnosis not present

## 2024-01-22 DIAGNOSIS — Z0000 Encounter for general adult medical examination without abnormal findings: Secondary | ICD-10-CM | POA: Diagnosis not present

## 2024-01-26 DIAGNOSIS — K219 Gastro-esophageal reflux disease without esophagitis: Secondary | ICD-10-CM | POA: Diagnosis not present

## 2024-01-26 DIAGNOSIS — Z0000 Encounter for general adult medical examination without abnormal findings: Secondary | ICD-10-CM | POA: Diagnosis not present

## 2024-01-26 DIAGNOSIS — S46902A Unspecified injury of unspecified muscle, fascia and tendon at shoulder and upper arm level, left arm, initial encounter: Secondary | ICD-10-CM | POA: Diagnosis not present

## 2024-01-26 DIAGNOSIS — Z6830 Body mass index (BMI) 30.0-30.9, adult: Secondary | ICD-10-CM | POA: Diagnosis not present

## 2024-01-26 DIAGNOSIS — Z125 Encounter for screening for malignant neoplasm of prostate: Secondary | ICD-10-CM | POA: Diagnosis not present

## 2024-02-24 DIAGNOSIS — R972 Elevated prostate specific antigen [PSA]: Secondary | ICD-10-CM | POA: Diagnosis not present

## 2024-02-24 DIAGNOSIS — N400 Enlarged prostate without lower urinary tract symptoms: Secondary | ICD-10-CM | POA: Diagnosis not present

## 2024-04-13 DIAGNOSIS — Z4789 Encounter for other orthopedic aftercare: Secondary | ICD-10-CM | POA: Diagnosis not present

## 2024-04-15 ENCOUNTER — Other Ambulatory Visit: Payer: Self-pay | Admitting: Urology

## 2024-04-15 DIAGNOSIS — R972 Elevated prostate specific antigen [PSA]: Secondary | ICD-10-CM

## 2024-04-19 ENCOUNTER — Encounter: Payer: Self-pay | Admitting: Urology

## 2024-04-28 ENCOUNTER — Ambulatory Visit
Admission: RE | Admit: 2024-04-28 | Discharge: 2024-04-28 | Disposition: A | Discharge status: Home / Self Care | Source: Ambulatory Visit | Attending: Urology | Admitting: Urology

## 2024-04-28 DIAGNOSIS — N400 Enlarged prostate without lower urinary tract symptoms: Secondary | ICD-10-CM | POA: Diagnosis not present

## 2024-04-28 DIAGNOSIS — R972 Elevated prostate specific antigen [PSA]: Secondary | ICD-10-CM

## 2024-04-28 DIAGNOSIS — K5730 Diverticulosis of large intestine without perforation or abscess without bleeding: Secondary | ICD-10-CM | POA: Diagnosis not present

## 2024-04-28 MED ORDER — GADOPICLENOL 0.5 MMOL/ML IV SOLN
10.0000 mL | Freq: Once | INTRAVENOUS | Status: AC | PRN
Start: 2024-04-28 — End: 2024-04-28
  Administered 2024-04-28: 10 mL via INTRAVENOUS

## 2024-05-02 NOTE — Progress Notes (Unsigned)
 Cole Canard, PA-C [address] [place], [place]  [address] Phone: [phone]   Primary Care Physician: Cole Hanson, FNP  Primary Gastroenterologist:  Cole Canard, PA-C / Loy Ruff, MD   Chief Complaint:  F/U GERD       HPI:   Cole Espinoza is a 53 y.o. male, previous patient of Dr. Savannah Espinoza, returns for follow-up of acid reflux.  Patient is requesting transfer of care to Dr. Willy Espinoza.  Patient states he has had flareup of acid reflux at nighttime for the past few months.  Also notices some epigastric burning after eating.  Has not been on any medicine for acid reflux for several years.  His PCP recently started him on omeprazole 20 Mg once daily and he is feeling a lot better.  He has not had any heartburn or epigastric pain in the past week.  He denies dysphagia or weight loss.  He is also having flareup of constipation, hemorrhoids, and rectal bleeding for several weeks.  He has noticed bright red blood in the toilet and on the tissue after bowel movements in the past 2 weeks.  He feels a swollen hemorrhoid.  Has noticed increased constipation, no current treatment.  Denies abdominal pain.  06/2021 Colonoscopy by Dr. Savannah Espinoza: 2 small (2 mm to 3 mm) polyps removed.  Pathology showed 1 sessile serrated polyp and 1 benign colon mucosal polyp.  Pandiverticulosis.  Nonbleeding external and internal hemorrhoids.  Prep was good, however extensive lavage was necessary.  5-year repeat (06/2026).  01/2012 EGD by Dr. Adan Holms: 4 cm hiatal hernia, otherwise normal EGD.  Empiric esophageal dilation was performed.  Biopsies negative for EOE.  Current Outpatient Medications  Medication Sig Dispense Refill   FIBER PO Take 1 Dose by mouth as needed.     hydrocortisone (ANUSOL-HC) 2.5 % rectal cream Place 1 Application rectally 2 (two) times daily. 30 g 1   hydrocortisone (ANUSOL-HC) 25 MG suppository Place 1 suppository (25 mg total) rectally at bedtime for 24 days. 12 suppository 1    ibuprofen (ADVIL) 200 MG tablet Take 200 mg by mouth every 6 (six) hours as needed.     omeprazole (PRILOSEC) 20 MG capsule Take 20 mg by mouth daily.     polyethylene glycol powder (GLYCOLAX/MIRALAX) 17 GM/SCOOP powder Take 17 g by mouth as needed.     Current Facility-Administered Medications  Medication Dose Route Frequency Provider Last Rate Last Admin   0.9 %  sodium chloride  infusion  500 mL Intravenous Once Beavers, Kimberly, MD        Allergies as of 05/03/2024   (No Known Allergies)    Past Medical History:  Diagnosis Date   Allergy    SEASONAL   Elevated PSA    Esophageal reflux    Family history of malignant neoplasm of gastrointestinal tract    Hiatal hernia    Parathyroid  adenoma     Past Surgical History:  Procedure Laterality Date   KNEE ARTHROSCOPY Right 12/11/2023   Procedure: ARTHROSCOPY KNEE--PARTIAL LATERAL MENISECTOMY;  Surgeon: Janeth Medicus, MD;  Location: [facility];  Service: Orthopedics;  Laterality: Right;   MENISCUS REPAIR Right 12/11/2023   Procedure: REPAIR OF MEDIAL ROOT;  Surgeon: Janeth Medicus, MD;  Location: [facility];  Service: Orthopedics;  Laterality: Right;   PARATHYROIDECTOMY  11/25/2007   UPPER GASTROINTESTINAL ENDOSCOPY  2013   WRIST SURGERY     LEFT    Review of Systems:  All systems reviewed and negative except where noted in HPI.    Physical Exam:  BP 132/82 (BP Location: Left Arm, Patient Position: Sitting, Cuff Size: Normal)   Pulse 72   Ht 5\' 11"  (1.803 m) Comment: height measured without shoes  Wt 222 lb (100.7 kg)   BMI 30.96 kg/m  No LMP for male patient.  General: Well-nourished, well-developed in no acute distress.  Lungs: Clear to auscultation bilaterally. Non-labored. Heart: Regular rate and rhythm, no murmurs rubs or gallops.  Abdomen: Bowel sounds are normal; Abdomen is Soft; No hepatosplenomegaly, masses or hernias;  No Abdominal Tenderness; No guarding or  rebound tenderness. Rectal: 3 swollen tender nonthrombosed hemorrhoids at the anus.  Tight anal sphincter tone.  Palpable internal hemorrhoids.  No gross blood.  Hemoccult negative.  Mild tenderness. Neuro: Alert and oriented x 3.  Grossly intact.  Psych: Alert and cooperative, normal mood and affect.  Chaperone for Exam:  Lianne Redo, CMA   Imaging Studies: No results found.  Labs: CBC    Component Value Date/Time   WBC 5.6 05/03/2024 1040   RBC 5.23 05/03/2024 1040   HGB 15.0 05/03/2024 1040   HCT 44.4 05/03/2024 1040   PLT 241.0 05/03/2024 1040   MCV 85.0 05/03/2024 1040   MCHC 33.8 05/03/2024 1040   RDW 13.4 05/03/2024 1040   LYMPHSABS 1.5 05/03/2024 1040   MONOABS 0.4 05/03/2024 1040   EOSABS 0.5 05/03/2024 1040   BASOSABS 0.0 05/03/2024 1040    CMP     Component Value Date/Time   NA 142 08/10/2007 1411   K 3.7 08/10/2007 1411   CL 107 08/10/2007 1411   CO2 30 08/10/2007 1411   GLUCOSE 104 (H) 08/10/2007 1411   BUN 12 08/10/2007 1411   CREATININE 0.9 08/10/2007 1411   CALCIUM 11.6 (H) 09/02/2007 2230   PROT 7.0 08/10/2007 1411   ALBUMIN 4.3 08/10/2007 1411   AST 35 08/10/2007 1411   ALT 44 08/10/2007 1411   ALKPHOS 81 08/10/2007 1411   BILITOT 1.2 08/10/2007 1411   GFRNONAA 101 08/10/2007 1411   GFRAA 122 08/10/2007 1411       Assessment and Plan:   Cole Espinoza is a 53 y.o. y/o male presents for flareup of:  1.  Chronic GERD and Medium Hiatal Hernia - Improved on omeprazole 20 Mg daily. - Continue omeprazole 20 Mg once daily. - Recommend Lifestyle Modifications to prevent Acid Reflux.  Rec. Avoid coffee, sodas, peppermint, garlic, onions, alcohol, citrus fruits, chocolate, tomatoes, fatty and spicey foods.  Avoid eating 2-3 hours before bedtime.   - Add OTC Pepcid 20mg  twice daily if needed.  2.  Constipation - Take any of the following Over the Counter Treatments: Miralax Mix 1 capful in a drink daily. Benefiber Mix 1 TBSP in a drink  daily. Colace (docusate sodium) 100mg  1 capsule once daily. - Recommend High Fiber diet with fruits, vegetables, and whole grains. - Drink 64 ounces of Fluids Daily.  3.  Inflammed Internal and External Hemorrhoids with Bleeding - Lab CBC to check for anemia For External Hemorrhoids: Rx Hydrocortisone Cream 2.5% Cream, Apply 2 - 3 times daily for up to 2 weeks. Use OTC Preparation H, Tucks Pads, and Witch Hazel wipes as needed.  For Internal Hemorrhoids: Rx Hydrocortisone Suppositories 25mg  Insert 1 into rectum once daily at bedtime for 10-14 days. Avoid Sitting on the toilet for prolonged amount of time. Discussed Internal Hemorrhoid Banding if no improvement with conservative treament.   4.  History  of adenomatous colon polyp - 5-year repeat colonoscopy will be due 06/2026. - Consider repeat colonoscopy sooner if he has persistent rectal bleeding.   Cole Canard, PA-C  Follow up in 2 months with TG.  Follow-up sooner if symptoms worsen or persist.

## 2024-05-03 ENCOUNTER — Ambulatory Visit: Payer: Self-pay | Admitting: Physician Assistant

## 2024-05-03 ENCOUNTER — Encounter: Payer: Self-pay | Admitting: Physician Assistant

## 2024-05-03 ENCOUNTER — Ambulatory Visit: Admitting: Physician Assistant

## 2024-05-03 ENCOUNTER — Other Ambulatory Visit (INDEPENDENT_AMBULATORY_CARE_PROVIDER_SITE_OTHER)

## 2024-05-03 VITALS — BP 132/82 | HR 72 | Ht 71.0 in | Wt 222.0 lb

## 2024-05-03 DIAGNOSIS — K219 Gastro-esophageal reflux disease without esophagitis: Secondary | ICD-10-CM

## 2024-05-03 DIAGNOSIS — K648 Other hemorrhoids: Secondary | ICD-10-CM | POA: Diagnosis not present

## 2024-05-03 DIAGNOSIS — K5904 Chronic idiopathic constipation: Secondary | ICD-10-CM

## 2024-05-03 DIAGNOSIS — Z860101 Personal history of adenomatous and serrated colon polyps: Secondary | ICD-10-CM

## 2024-05-03 DIAGNOSIS — K449 Diaphragmatic hernia without obstruction or gangrene: Secondary | ICD-10-CM | POA: Diagnosis not present

## 2024-05-03 DIAGNOSIS — K625 Hemorrhage of anus and rectum: Secondary | ICD-10-CM

## 2024-05-03 DIAGNOSIS — K5900 Constipation, unspecified: Secondary | ICD-10-CM | POA: Diagnosis not present

## 2024-05-03 DIAGNOSIS — K644 Residual hemorrhoidal skin tags: Secondary | ICD-10-CM

## 2024-05-03 DIAGNOSIS — Z860100 Personal history of colon polyps, unspecified: Secondary | ICD-10-CM

## 2024-05-03 DIAGNOSIS — K649 Unspecified hemorrhoids: Secondary | ICD-10-CM

## 2024-05-03 LAB — CBC WITH DIFFERENTIAL/PLATELET
Basophils Absolute: 0 10*3/uL (ref 0.0–0.1)
Basophils Relative: 0.8 % (ref 0.0–3.0)
Eosinophils Absolute: 0.5 10*3/uL (ref 0.0–0.7)
Eosinophils Relative: 8.9 % — ABNORMAL HIGH (ref 0.0–5.0)
HCT: 44.4 % (ref 39.0–52.0)
Hemoglobin: 15 g/dL (ref 13.0–17.0)
Lymphocytes Relative: 27.7 % (ref 12.0–46.0)
Lymphs Abs: 1.5 10*3/uL (ref 0.7–4.0)
MCHC: 33.8 g/dL (ref 30.0–36.0)
MCV: 85 fl (ref 78.0–100.0)
Monocytes Absolute: 0.4 10*3/uL (ref 0.1–1.0)
Monocytes Relative: 7.4 % (ref 3.0–12.0)
Neutro Abs: 3.1 10*3/uL (ref 1.4–7.7)
Neutrophils Relative %: 55.2 % (ref 43.0–77.0)
Platelets: 241 10*3/uL (ref 150.0–400.0)
RBC: 5.23 Mil/uL (ref 4.22–5.81)
RDW: 13.4 % (ref 11.5–15.5)
WBC: 5.6 10*3/uL (ref 4.0–10.5)

## 2024-05-03 MED ORDER — HYDROCORTISONE (PERIANAL) 2.5 % EX CREA: 1.0000 | TOPICAL_CREAM | Freq: Two times a day (BID) | CUTANEOUS | 1 refills | Status: AC

## 2024-05-03 MED ORDER — HYDROCORTISONE ACETATE 25 MG RE SUPP: 25.0000 mg | Freq: Every day | RECTAL | 1 refills | Status: AC

## 2024-05-03 NOTE — Patient Instructions (Addendum)
 Continue Omeprazole 20mg  1 tablet daily to treat Acid Reflux. Recommend Lifestyle Modifications to prevent Acid Reflux.  Rec. Avoid coffee, sodas, peppermint, garlic, onions, alcohol, citrus fruits, chocolate, tomatoes, fatty and spicey foods.  Avoid eating 2-3 hours before bedtime.    We have sent the following medications to your pharmacy for you to pick up at your convenience: Hydrocortisone Cream 2.5% and Hydrocortisone Suppositories 25mg   For External Hemorrhoids: Rx Hydrocortisone Cream 2.5% Cream, Apply 2 - 3 times daily for up to 2 weeks. Use OTC Preparation H, Tucks Pads, and Witch Hazel wipes as needed.  For Internal Hemorrhoids: Rx Hydrocortisone Suppositories 25mg  Insert 1 into rectum once daily at bedtime for 10-14 days. Avoid Sitting on the toilet for prolonged amount of time. Discussed Internal Hemorrhoid Banding if no improvement with conservative treament.   It is important to treat underlying constipation to prevent Hemorrhoids. You can take any of the following Over the Counter Treatments: Miralax Mix 1 capful in a drink daily. Benefiber Mix 1 TBSP in a drink daily. Colace (docusate sodium) 100mg  1 capsule once daily.  Recommend High Fiber diet with fruits, vegetables, and whole grains. Drink 64 ounces of Fluids Daily. _________________________________________________________  Your provider has requested that you go to the basement level for lab work before leaving today. Press "B" on the elevator. The lab is located at the first door on the left as you exit the elevator.  Please follow up sooner if symptoms increase or worsen  Due to recent changes in healthcare laws, you may see the results of your imaging and laboratory studies on MyChart before your provider has had a chance to review them.  We understand that in some cases there may be results that are confusing or concerning to you. Not all laboratory results come back in the same time frame and the provider may  be waiting for multiple results in order to interpret others.  Please give us  48 hours in order for your provider to thoroughly review all the results before contacting the office for clarification of your results.   Thank you for trusting me with your gastrointestinal care!   Brigitte Canard, PA-C _______________________________________________________  If your blood pressure at your visit was 140/90 or greater, please contact your primary care physician to follow up on this.  _______________________________________________________  If you are age 57 or older, your body mass index should be between 23-30. Your Body mass index is 30.96 kg/m. If this is out of the aforementioned range listed, please consider follow up with your Primary Care Provider.  If you are age 60 or younger, your body mass index should be between 19-25. Your Body mass index is 30.96 kg/m. If this is out of the aformentioned range listed, please consider follow up with your Primary Care Provider.   ________________________________________________________  The [facility] providers would like to encourage you to use MYCHART to communicate with providers for non-urgent requests or questions.  Due to long hold times on the telephone, sending your provider a message by [facility] may be a faster and more efficient way to get a response.  Please allow 48 business hours for a response.  Please remember that this is for non-urgent requests.  _______________________________________________________

## 2024-06-02 ENCOUNTER — Ambulatory Visit: Admitting: Physician Assistant

## 2024-08-09 DIAGNOSIS — Z6830 Body mass index (BMI) 30.0-30.9, adult: Secondary | ICD-10-CM | POA: Diagnosis not present

## 2024-08-09 DIAGNOSIS — J0110 Acute frontal sinusitis, unspecified: Secondary | ICD-10-CM | POA: Diagnosis not present

## 2024-08-09 DIAGNOSIS — R0981 Nasal congestion: Secondary | ICD-10-CM | POA: Diagnosis not present

## 2024-11-01 DIAGNOSIS — R972 Elevated prostate specific antigen [PSA]: Secondary | ICD-10-CM | POA: Diagnosis not present

## 2024-11-08 DIAGNOSIS — N401 Enlarged prostate with lower urinary tract symptoms: Secondary | ICD-10-CM | POA: Diagnosis not present

## 2024-11-08 DIAGNOSIS — R3912 Poor urinary stream: Secondary | ICD-10-CM | POA: Diagnosis not present

## 2024-11-08 DIAGNOSIS — R350 Frequency of micturition: Secondary | ICD-10-CM | POA: Diagnosis not present

## 2024-11-08 DIAGNOSIS — R351 Nocturia: Secondary | ICD-10-CM | POA: Diagnosis not present

## 2024-11-08 DIAGNOSIS — R972 Elevated prostate specific antigen [PSA]: Secondary | ICD-10-CM | POA: Diagnosis not present
